# Patient Record
Sex: Female | Born: 1955 | Race: Black or African American | Hispanic: No | Marital: Married
Health system: Southern US, Community
[De-identification: ages and names within clinical notes are randomized; demographics above are authoritative.]

## PROBLEM LIST (undated history)

## (undated) SURGERY — Surgical Case
Anesthesia: *Unknown

---

## 2020-06-30 ENCOUNTER — Encounter (HOSPITAL_COMMUNITY): Payer: Self-pay

## 2020-06-30 ENCOUNTER — Encounter (HOSPITAL_COMMUNITY)
Admission: EM | Disposition: A | Discharge status: Home / Self Care | Payer: Self-pay | Source: Home / Self Care | Attending: Internal Medicine

## 2020-06-30 ENCOUNTER — Ambulatory Visit: Payer: Self-pay | Admitting: Oral Surgery

## 2020-06-30 ENCOUNTER — Inpatient Hospital Stay (HOSPITAL_COMMUNITY): Payer: BC Managed Care – PPO | Admitting: Anesthesiology

## 2020-06-30 ENCOUNTER — Emergency Department (HOSPITAL_COMMUNITY): Payer: BC Managed Care – PPO

## 2020-06-30 ENCOUNTER — Other Ambulatory Visit: Payer: Self-pay

## 2020-06-30 ENCOUNTER — Inpatient Hospital Stay (HOSPITAL_COMMUNITY)
Admission: EM | Admit: 2020-06-30 | Discharge: 2020-07-04 | DRG: 158 | Disposition: A | Discharge status: Home / Self Care | Payer: BC Managed Care – PPO | Attending: Internal Medicine | Admitting: Internal Medicine

## 2020-06-30 DIAGNOSIS — M272 Inflammatory conditions of jaws: Secondary | ICD-10-CM | POA: Diagnosis present

## 2020-06-30 DIAGNOSIS — R0600 Dyspnea, unspecified: Secondary | ICD-10-CM | POA: Diagnosis not present

## 2020-06-30 DIAGNOSIS — Z20822 Contact with and (suspected) exposure to covid-19: Secondary | ICD-10-CM | POA: Diagnosis present

## 2020-06-30 DIAGNOSIS — I10 Essential (primary) hypertension: Secondary | ICD-10-CM | POA: Diagnosis present

## 2020-06-30 DIAGNOSIS — R339 Retention of urine, unspecified: Secondary | ICD-10-CM | POA: Diagnosis not present

## 2020-06-30 DIAGNOSIS — L0201 Cutaneous abscess of face: Secondary | ICD-10-CM | POA: Diagnosis present

## 2020-06-30 DIAGNOSIS — D72829 Elevated white blood cell count, unspecified: Secondary | ICD-10-CM | POA: Diagnosis not present

## 2020-06-30 DIAGNOSIS — K122 Cellulitis and abscess of mouth: Principal | ICD-10-CM | POA: Diagnosis present

## 2020-06-30 DIAGNOSIS — E119 Type 2 diabetes mellitus without complications: Secondary | ICD-10-CM | POA: Diagnosis present

## 2020-06-30 DIAGNOSIS — Z7984 Long term (current) use of oral hypoglycemic drugs: Secondary | ICD-10-CM

## 2020-06-30 DIAGNOSIS — R221 Localized swelling, mass and lump, neck: Secondary | ICD-10-CM | POA: Diagnosis present

## 2020-06-30 DIAGNOSIS — L0211 Cutaneous abscess of neck: Secondary | ICD-10-CM | POA: Diagnosis present

## 2020-06-30 DIAGNOSIS — K047 Periapical abscess without sinus: Secondary | ICD-10-CM | POA: Diagnosis present

## 2020-06-30 DIAGNOSIS — B955 Unspecified streptococcus as the cause of diseases classified elsewhere: Secondary | ICD-10-CM | POA: Diagnosis present

## 2020-06-30 DIAGNOSIS — E872 Acidosis: Secondary | ICD-10-CM | POA: Diagnosis present

## 2020-06-30 DIAGNOSIS — Z79899 Other long term (current) drug therapy: Secondary | ICD-10-CM

## 2020-06-30 DIAGNOSIS — F17210 Nicotine dependence, cigarettes, uncomplicated: Secondary | ICD-10-CM | POA: Diagnosis present

## 2020-06-30 DIAGNOSIS — E785 Hyperlipidemia, unspecified: Secondary | ICD-10-CM | POA: Diagnosis present

## 2020-06-30 DIAGNOSIS — T380X5A Adverse effect of glucocorticoids and synthetic analogues, initial encounter: Secondary | ICD-10-CM | POA: Diagnosis not present

## 2020-06-30 DIAGNOSIS — L03211 Cellulitis of face: Secondary | ICD-10-CM | POA: Diagnosis present

## 2020-06-30 DIAGNOSIS — E11649 Type 2 diabetes mellitus with hypoglycemia without coma: Secondary | ICD-10-CM | POA: Diagnosis not present

## 2020-06-30 DIAGNOSIS — E876 Hypokalemia: Secondary | ICD-10-CM | POA: Diagnosis present

## 2020-06-30 HISTORY — PX: MASS EXCISION: SHX2000

## 2020-06-30 LAB — CBC WITH DIFFERENTIAL/PLATELET
Abs Immature Granulocytes: 0.07 10*3/uL (ref 0.00–0.07)
Basophils Absolute: 0.1 10*3/uL (ref 0.0–0.1)
Basophils Relative: 0 %
Eosinophils Absolute: 0.1 10*3/uL (ref 0.0–0.5)
Eosinophils Relative: 1 %
HCT: 40.5 % (ref 36.0–46.0)
Hemoglobin: 12.8 g/dL (ref 12.0–15.0)
Immature Granulocytes: 1 %
Lymphocytes Relative: 13 %
Lymphs Abs: 2 10*3/uL (ref 0.7–4.0)
MCH: 23.5 pg — ABNORMAL LOW (ref 26.0–34.0)
MCHC: 31.6 g/dL (ref 30.0–36.0)
MCV: 74.3 fL — ABNORMAL LOW (ref 80.0–100.0)
Monocytes Absolute: 1.8 10*3/uL — ABNORMAL HIGH (ref 0.1–1.0)
Monocytes Relative: 12 %
Neutro Abs: 11.2 10*3/uL — ABNORMAL HIGH (ref 1.7–7.7)
Neutrophils Relative %: 73 %
Platelets: 329 10*3/uL (ref 150–400)
RBC: 5.45 MIL/uL — ABNORMAL HIGH (ref 3.87–5.11)
RDW: 13.6 % (ref 11.5–15.5)
WBC: 15.1 10*3/uL — ABNORMAL HIGH (ref 4.0–10.5)
nRBC: 0 % (ref 0.0–0.2)

## 2020-06-30 LAB — CBC
HCT: 38.6 % (ref 36.0–46.0)
Hemoglobin: 12.3 g/dL (ref 12.0–15.0)
MCH: 23.6 pg — ABNORMAL LOW (ref 26.0–34.0)
MCHC: 31.9 g/dL (ref 30.0–36.0)
MCV: 73.9 fL — ABNORMAL LOW (ref 80.0–100.0)
Platelets: 270 10*3/uL (ref 150–400)
RBC: 5.22 MIL/uL — ABNORMAL HIGH (ref 3.87–5.11)
RDW: 13.4 % (ref 11.5–15.5)
WBC: 8.3 10*3/uL (ref 4.0–10.5)
nRBC: 0 % (ref 0.0–0.2)

## 2020-06-30 LAB — BASIC METABOLIC PANEL
Anion gap: 13 (ref 5–15)
BUN: 19 mg/dL (ref 8–23)
CO2: 21 mmol/L — ABNORMAL LOW (ref 22–32)
Calcium: 10.1 mg/dL (ref 8.9–10.3)
Chloride: 105 mmol/L (ref 98–111)
Creatinine, Ser: 0.84 mg/dL (ref 0.44–1.00)
GFR, Estimated: >60 mL/min (ref >60)
Glucose, Bld: 128 mg/dL — ABNORMAL HIGH (ref 70–99)
Potassium: 4.7 mmol/L (ref 3.5–5.1)
Sodium: 139 mmol/L (ref 135–145)

## 2020-06-30 LAB — CREATININE, SERUM
Creatinine, Ser: 0.9 mg/dL (ref 0.44–1.00)
GFR, Estimated: >60 mL/min (ref >60)

## 2020-06-30 LAB — HEMOGLOBIN A1C
Hgb A1c MFr Bld: 7 % — ABNORMAL HIGH (ref 4.8–5.6)
Mean Plasma Glucose: 154.2 mg/dL

## 2020-06-30 LAB — GLUCOSE, CAPILLARY
Glucose-Capillary: 143 mg/dL — ABNORMAL HIGH (ref 70–99)
Glucose-Capillary: 159 mg/dL — ABNORMAL HIGH (ref 70–99)
Glucose-Capillary: 130 mg/dL — ABNORMAL HIGH (ref 70–99)

## 2020-06-30 LAB — LACTIC ACID, PLASMA
Lactic Acid, Venous: 1.2 mmol/L (ref 0.5–1.9)
Lactic Acid, Venous: 2.1 mmol/L (ref 0.5–1.9)

## 2020-06-30 LAB — SARS CORONAVIRUS 2 BY RT PCR (HOSPITAL ORDER, PERFORMED IN ~~LOC~~ HOSPITAL LAB): SARS Coronavirus 2: NEGATIVE

## 2020-06-30 SURGERY — EXCISION MASS
Anesthesia: General

## 2020-06-30 MED ORDER — HYDROMORPHONE HCL 1 MG/ML IJ SOLN: 0.2500 mg | INTRAMUSCULAR | Status: DC | PRN

## 2020-06-30 MED ORDER — MORPHINE SULFATE (PF) 2 MG/ML IV SOLN
1.0000 mg | INTRAVENOUS | Status: DC | PRN
Start: 2020-06-30 — End: 2020-07-04
  Administered 2020-06-30 – 2020-07-01 (×2): 1 mg via INTRAVENOUS
  Filled 2020-06-30 (×2): qty 1

## 2020-06-30 MED ORDER — FENTANYL CITRATE (PF) 250 MCG/5ML IJ SOLN
INTRAMUSCULAR | Status: AC
  Filled 2020-06-30: qty 5

## 2020-06-30 MED ORDER — DEXAMETHASONE SODIUM PHOSPHATE 10 MG/ML IJ SOLN
10.0000 mg | Freq: Once | INTRAMUSCULAR | Status: AC
  Administered 2020-06-30: 10 mg via INTRAVENOUS
  Filled 2020-06-30: qty 1

## 2020-06-30 MED ORDER — ASPIRIN EC 81 MG PO TBEC
81.0000 mg | DELAYED_RELEASE_TABLET | Freq: Every day | ORAL | Status: DC
  Administered 2020-07-01 – 2020-07-04 (×4): 81 mg via ORAL
  Filled 2020-06-30 (×5): qty 1

## 2020-06-30 MED ORDER — FENTANYL CITRATE (PF) 100 MCG/2ML IJ SOLN
INTRAMUSCULAR | Status: DC | PRN
  Administered 2020-06-30: 100 ug via INTRAVENOUS

## 2020-06-30 MED ORDER — LATANOPROST 0.005 % OP SOLN
1.0000 [drp] | Freq: Every day | OPHTHALMIC | Status: DC
  Administered 2020-06-30 – 2020-07-03 (×4): 1 [drp] via OPHTHALMIC
  Filled 2020-06-30: qty 2.5

## 2020-06-30 MED ORDER — CHLORHEXIDINE GLUCONATE CLOTH 2 % EX PADS: 6.0000 | MEDICATED_PAD | Freq: Once | CUTANEOUS | Status: DC

## 2020-06-30 MED ORDER — IOHEXOL 300 MG/ML  SOLN
75.0000 mL | Freq: Once | INTRAMUSCULAR | Status: AC | PRN
  Administered 2020-06-30: 75 mL via INTRAVENOUS

## 2020-06-30 MED ORDER — SODIUM CHLORIDE 0.9 % IV BOLUS
500.0000 mL | Freq: Once | INTRAVENOUS | Status: AC
  Administered 2020-06-30: 500 mL via INTRAVENOUS

## 2020-06-30 MED ORDER — SODIUM CHLORIDE 0.9 % IV SOLN
3.0000 g | Freq: Four times a day (QID) | INTRAVENOUS | Status: DC
  Administered 2020-06-30 – 2020-07-04 (×14): 3 g via INTRAVENOUS
  Filled 2020-06-30: qty 8
  Filled 2020-06-30 (×2): qty 3
  Filled 2020-06-30: qty 8
  Filled 2020-06-30: qty 3
  Filled 2020-06-30: qty 0.15
  Filled 2020-06-30 (×2): qty 3
  Filled 2020-06-30: qty 8
  Filled 2020-06-30 (×5): qty 3
  Filled 2020-06-30: qty 0.15
  Filled 2020-06-30: qty 3
  Filled 2020-06-30 (×2): qty 8

## 2020-06-30 MED ORDER — PRAVASTATIN SODIUM 10 MG PO TABS
20.0000 mg | ORAL_TABLET | Freq: Every day | ORAL | Status: DC
  Administered 2020-07-01 – 2020-07-04 (×4): 20 mg via ORAL
  Filled 2020-06-30 (×5): qty 2

## 2020-06-30 MED ORDER — LACTATED RINGERS IV SOLN: INTRAVENOUS | Status: DC | PRN

## 2020-06-30 MED ORDER — SODIUM CHLORIDE 0.9 % IV BOLUS
1000.0000 mL | Freq: Once | INTRAVENOUS | Status: AC
  Administered 2020-06-30: 1000 mL via INTRAVENOUS

## 2020-06-30 MED ORDER — LIDOCAINE 2% (20 MG/ML) 5 ML SYRINGE
INTRAMUSCULAR | Status: DC | PRN
  Administered 2020-06-30: 3 mg via INTRAVENOUS

## 2020-06-30 MED ORDER — 0.9 % SODIUM CHLORIDE (POUR BTL) OPTIME
TOPICAL | Status: DC | PRN
  Administered 2020-06-30: 1000 mL

## 2020-06-30 MED ORDER — LACTATED RINGERS IV SOLN: INTRAVENOUS | Status: AC

## 2020-06-30 MED ORDER — ENOXAPARIN SODIUM 40 MG/0.4ML ~~LOC~~ SOLN
40.0000 mg | SUBCUTANEOUS | Status: DC
  Administered 2020-07-01 – 2020-07-04 (×4): 40 mg via SUBCUTANEOUS
  Filled 2020-06-30 (×4): qty 0.4

## 2020-06-30 MED ORDER — MEPERIDINE HCL 25 MG/ML IJ SOLN: 6.2500 mg | INTRAMUSCULAR | Status: DC | PRN

## 2020-06-30 MED ORDER — MIDAZOLAM HCL 5 MG/5ML IJ SOLN
INTRAMUSCULAR | Status: DC | PRN
  Administered 2020-06-30: 2 mg via INTRAVENOUS

## 2020-06-30 MED ORDER — LIDOCAINE-EPINEPHRINE 1 %-1:100000 IJ SOLN
INTRAMUSCULAR | Status: DC | PRN
  Administered 2020-06-30: 8 mL

## 2020-06-30 MED ORDER — GABAPENTIN 300 MG PO CAPS
300.0000 mg | ORAL_CAPSULE | Freq: Three times a day (TID) | ORAL | Status: DC
  Administered 2020-07-01 – 2020-07-04 (×10): 300 mg via ORAL
  Filled 2020-06-30 (×11): qty 1

## 2020-06-30 MED ORDER — SUCCINYLCHOLINE CHLORIDE 20 MG/ML IJ SOLN
INTRAMUSCULAR | Status: DC | PRN
  Administered 2020-06-30: 100 mg via INTRAVENOUS

## 2020-06-30 MED ORDER — VANCOMYCIN HCL IN DEXTROSE 1-5 GM/200ML-% IV SOLN
1000.0000 mg | INTRAVENOUS | Status: DC
  Filled 2020-06-30: qty 200

## 2020-06-30 MED ORDER — PROPOFOL 10 MG/ML IV BOLUS
INTRAVENOUS | Status: DC | PRN
  Administered 2020-06-30: 200 mg via INTRAVENOUS

## 2020-06-30 MED ORDER — VANCOMYCIN HCL 1250 MG/250ML IV SOLN
1250.0000 mg | Freq: Once | INTRAVENOUS | Status: AC
  Administered 2020-06-30: 1250 mg via INTRAVENOUS
  Filled 2020-06-30: qty 250

## 2020-06-30 MED ORDER — PROMETHAZINE HCL 25 MG/ML IJ SOLN
6.2500 mg | INTRAMUSCULAR | Status: DC | PRN
Start: 2020-06-30 — End: 2020-06-30

## 2020-06-30 MED ORDER — LISINOPRIL 2.5 MG PO TABS
2.5000 mg | ORAL_TABLET | Freq: Every day | ORAL | Status: DC
  Administered 2020-07-01 – 2020-07-03 (×3): 2.5 mg via ORAL
  Filled 2020-06-30 (×4): qty 1

## 2020-06-30 MED ORDER — LIDOCAINE-EPINEPHRINE 2 %-1:100000 IJ SOLN
INTRAMUSCULAR | Status: AC
  Filled 2020-06-30: qty 1

## 2020-06-30 MED ORDER — INSULIN ASPART 100 UNIT/ML ~~LOC~~ SOLN
0.0000 [IU] | SUBCUTANEOUS | Status: DC
  Administered 2020-06-30: 1 [IU] via SUBCUTANEOUS
  Administered 2020-07-01: 2 [IU] via SUBCUTANEOUS
  Administered 2020-07-01 (×2): 1 [IU] via SUBCUTANEOUS
  Administered 2020-07-01: 2 [IU] via SUBCUTANEOUS
  Administered 2020-07-02 (×4): 1 [IU] via SUBCUTANEOUS
  Administered 2020-07-02 (×2): 2 [IU] via SUBCUTANEOUS
  Administered 2020-07-02: 1 [IU] via SUBCUTANEOUS
  Administered 2020-07-03 – 2020-07-04 (×7): 2 [IU] via SUBCUTANEOUS

## 2020-06-30 MED ORDER — DEXMEDETOMIDINE HCL IN NACL 80 MCG/20ML IV SOLN
INTRAVENOUS | Status: AC
  Filled 2020-06-30: qty 20

## 2020-06-30 MED ORDER — CHLORHEXIDINE GLUCONATE 0.12 % MT SOLN
15.0000 mL | OROMUCOSAL | Status: AC
  Filled 2020-06-30: qty 15

## 2020-06-30 MED ORDER — VITAMIN B-12 1000 MCG PO TABS
1000.0000 ug | ORAL_TABLET | Freq: Every day | ORAL | Status: DC
  Administered 2020-07-01 – 2020-07-04 (×4): 1000 ug via ORAL
  Filled 2020-06-30 (×5): qty 1

## 2020-06-30 SURGICAL SUPPLY — 27 items
BLADE SURG 15 STRL LF DISP TIS (BLADE) ×1 IMPLANT
BLADE SURG 15 STRL SS (BLADE) ×1
BNDG GAUZE ELAST 4 BULKY (GAUZE/BANDAGES/DRESSINGS) ×2 IMPLANT
COVER SURGICAL LIGHT HANDLE (MISCELLANEOUS) ×2 IMPLANT
COVER WAND RF STERILE (DRAPES) ×2 IMPLANT
DECANTER SPIKE VIAL GLASS SM (MISCELLANEOUS) ×2 IMPLANT
DERMABOND ADVANCED (GAUZE/BANDAGES/DRESSINGS) ×1
DERMABOND ADVANCED .7 DNX12 (GAUZE/BANDAGES/DRESSINGS) ×1 IMPLANT
DRAPE HALF SHEET 40X57 (DRAPES) ×2 IMPLANT
ELECT COATED BLADE 2.86 ST (ELECTRODE) ×2 IMPLANT
ELECT REM PT RETURN 9FT ADLT (ELECTROSURGICAL) ×2
ELECTRODE REM PT RTRN 9FT ADLT (ELECTROSURGICAL) ×1 IMPLANT
GAUZE 4X4 16PLY RFD (DISPOSABLE) ×2 IMPLANT
GLOVE BIOGEL M 7.0 STRL (GLOVE) ×2 IMPLANT
GOWN STRL REUS W/ TWL LRG LVL3 (GOWN DISPOSABLE) ×2 IMPLANT
GOWN STRL REUS W/TWL LRG LVL3 (GOWN DISPOSABLE) ×2
KIT BASIN OR (CUSTOM PROCEDURE TRAY) ×2 IMPLANT
KIT TURNOVER KIT B (KITS) ×2 IMPLANT
NEEDLE HYPO 25GX1X1/2 BEV (NEEDLE) ×2 IMPLANT
NS IRRIG 1000ML POUR BTL (IV SOLUTION) ×2 IMPLANT
PAD ARMBOARD 7.5X6 YLW CONV (MISCELLANEOUS) ×4 IMPLANT
PENCIL SMOKE EVACUATOR (MISCELLANEOUS) ×2 IMPLANT
SUT CHROMIC 3 0 SH 27 (SUTURE) ×4 IMPLANT
SUT SILK 3 0SH CR/8 30 (SUTURE) ×2 IMPLANT
SYR CONTROL 10ML LL (SYRINGE) ×2 IMPLANT
TOWEL GREEN STERILE (TOWEL DISPOSABLE) ×2 IMPLANT
TRAY ENT MC OR (CUSTOM PROCEDURE TRAY) ×2 IMPLANT

## 2020-06-30 NOTE — Op Note (Signed)
06/30/2020  7:57 PM  PROCEDURE:  Procedure(s): I&D Mandible Abscess (N/A)  SURGEON:  Surgeon(s) and Role:    Ross Marcus, Lavell Anchors, DMD - Primary  PRE-OPERATIVE DIAGNOSIS:  Ludwig's Angina POST-OPERATIVE DIAGNOSIS:  Same   Procedure: Extra-oral I&D of bilateral sublingual abscess (41015 x 2) Extra-oral I&D of submental abscess (26948) Extra-oral I&D of bilateral submandibular abscess (41017 x 2)   Description of Operation/Procedure:   The patient was encountered in [facility] OR Room 5.  General anesthesia was induced and an oral endotracheal tube was secured in the standard fashion.  The table was moved slightly away from anesthesia and the patient was properly padded, relieving all pressure points.  A formal time-out was executed.  2% lidocaine with 1:100,000 epinephrine was infiltrated into the proposed surgical sites.  The patient was prepped and draped in the standard sterile fashion and a throat pack was placed.                   Using an 18 gauge needle, aspiration of the facial swelling was performed via an extraoral approach and was sent for aerobic and anaerobic cultures and a stat Gram stain.   Attention was then directed intraorally. A full thickness mucoperiosteal flap was elevated on the lingual aspect. A right submandibular and submental transcutaneous drainage sites were then carefully marked beneath the inferior border of the mandible to allow dependent drainage, avoid neurovascular structures, and to promote esthetics.  Incisions were made through skin and subcutaneous tissues and hemostasis was obtained.  The platysma was identified and divided.  Blunt dissection was carried to the mandible with intraoral digital guidance.  The following spaces were bluntly explored yielding purulent drainage: bilateral submandibular, sublingual, and submental.   Intraoral access was used to assure proper 1/4'" Penrose drain placement into each fascial space.  A total of 4 drains were placed and  secured with 3-0 nylon sutures in the standard fashion.  All areas were thoroughly irrigated.  The full thickness mucoperiosteal flap was reapproximated with 3-0 chromic gut suture.  A burn net dressing was fashioned to secure kerlix fluffs over the extraoral drains.The oral cavity was suctioned free of all debris and secretions.  The teeth were brushed. The throat pack was removed.  Sponge and needle counts were correct x 2.  Care of the patient was turned over to the Anesthesia team for uneventful extubation and delivery of the patient to the PACU in stable condition.  ANESTHESIA:   general EBL:  50 mL  DRAINS: Penrose drain in the neck x 4 LOCAL MEDICATIONS USED:  LIDOCAINE 2% w/ 1:100000 epi 8 cc SPECIMEN:  Aspirate - cultures for anaerobes, aerobes, and fungal PLAN OF CARE: Return to floor PATIENT DISPOSITION:  PACU - hemodynamically stable. Delay start of Pharmacological VTE agent (>24hrs) due to surgical blood loss or risk of bleeding: no  OMFS Recommendations -Liquid diet today, advance to soft mechanical diet tomorrow -Peridex (chlorohexidine) mouthrnise QID -Change Kerlix dressing as needed -Continue Unasyn 3g q6hr -Follow cultures -Daily CBC w/ diff

## 2020-06-30 NOTE — Consult Note (Addendum)
HPI: Regina Peterson is an 65 y.o. female with h/o HTN, DM2 developed facial swelling secondary to a dental abscess over a week ago. She was seen by Dr. Thea Gist in the office on 06/26/20 at which time she had her remaining mandibular teeth extracted and an incision & drainage of the submental space performed. She was discharged on Clindamycin and since the procedure she reports persistent swelling of the area and dysphagia that has not gotten significantly better or worse. She denies any dyspnea, trismus, or fever.   History reviewed. No pertinent past medical history.  History reviewed. No pertinent surgical history.  No family history on file.  Social History:  has no history on file for tobacco use, alcohol use, and drug use.  Allergies: Not on File  Medications: I have reviewed the patient's current medications.  Results for orders placed or performed during the hospital encounter of 06/30/20 (from the past 48 hour(s))  Basic metabolic panel     Status: Abnormal   Collection Time: 06/30/20 12:25 PM  Result Value Ref Range   Sodium 139 135 - 145 mmol/L   Potassium 4.7 3.5 - 5.1 mmol/L    Comment: SLIGHT HEMOLYSIS   Chloride 105 98 - 111 mmol/L   CO2 21 (L) 22 - 32 mmol/L   Glucose, Bld 128 (H) 70 - 99 mg/dL    Comment: Glucose reference range applies only to samples taken after fasting for at least 8 hours.   BUN 19 8 - 23 mg/dL   Creatinine, Ser 2.42 0.44 - 1.00 mg/dL   Calcium 68.3 8.9 - 41.9 mg/dL   GFR, Estimated >62 >22 mL/min    Comment: (NOTE) Calculated using the CKD-EPI Creatinine Equation (2021)    Anion gap 13 5 - 15    Comment: Performed at [facility], [address] [place], [place] [address]  CBC with Differential     Status: Abnormal   Collection Time: 06/30/20 12:25 PM  Result Value Ref Range   WBC 15.1 (H) 4.0 - 10.5 K/uL   RBC 5.45 (H) 3.87 - 5.11 MIL/uL   Hemoglobin 12.8 12.0 - 15.0 g/dL   HCT 21.1 94.1 - 74.0 %   MCV 74.3 (L) 80.0 - 100.0  fL   MCH 23.5 (L) 26.0 - 34.0 pg   MCHC 31.6 30.0 - 36.0 g/dL   RDW 81.4 48.1 - 85.6 %   Platelets 329 150 - 400 K/uL   nRBC 0.0 0.0 - 0.2 %   Neutrophils Relative % 73 %   Neutro Abs 11.2 (H) 1.7 - 7.7 K/uL   Lymphocytes Relative 13 %   Lymphs Abs 2.0 0.7 - 4.0 K/uL   Monocytes Relative 12 %   Monocytes Absolute 1.8 (H) 0.1 - 1.0 K/uL   Eosinophils Relative 1 %   Eosinophils Absolute 0.1 0.0 - 0.5 K/uL   Basophils Relative 0 %   Basophils Absolute 0.1 0.0 - 0.1 K/uL   Immature Granulocytes 1 %   Abs Immature Granulocytes 0.07 0.00 - 0.07 K/uL    Comment: Performed at [facility], [address], [place], [place] [address]    CT Soft Tissue Neck W Contrast  Result Date: 06/30/2020 CLINICAL DATA:  Neck abscess, deep soft tissue swelling. Post dental. EXAM: CT NECK WITH CONTRAST TECHNIQUE: Multidetector CT imaging of the neck was performed using the standard protocol following the bolus administration of intravenous contrast. CONTRAST:  62mL OMNIPAQUE IOHEXOL 300 MG/ML  SOLN COMPARISON:  None. FINDINGS:  There is a large multiloculated fluid collection involving the right eccentric floor of mouth with mild peripheral enhancement, which may extend from the root of the recently extracted mandibular teeth anteriorly on the right. The abscess is complex, measuring up to 3.5 by 3.8 x 1.6 (AP by transverse by craniocaudal). Abscess extends inferiorly to abut the hyoid without bony destruction. Inflammatory changes track posteriorly to involve the base of tongue region and submandibular spaces with mild mass effect on the airway, which remains patent. Inflammatory changes also extend inferiorly into the subcutaneous fat, compatible cellulitis. There is also myositis of the mylohyoid. There is reactive lymphadenopathy. An airfilled tube in the subcutaneous tissues of the chin which extends outside the skin, presumably surgical. Multiple extracted teeth. Unremarkable parotid glands. Normal thyroid.  Visualized upper chest is negative. Visualized intracranial brain is grossly unremarkable. Vasculature is grossly patent. Unremarkable visualized orbits. Mild paranasal sinus mucosal thickening without air-fluid levels. Extensive multilevel degenerative change with prior ACDF. IMPRESSION: 1. Multilocular complex floor mouth abscess, likely odontogenic in origin in this patient status post recent dental extractions. Extensive associated edema extends posteriorly with mild mass effect on the oropharynx. Associated myositis of the mylohyoid and surrounding cellulitis. 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 2. An airfilled tube in the subcutaneous tissues of the chin which extends outside the skin, presumably surgical. Recommend correlation with direct inspection. 3. Reactive lymphadenopathy. Findings discussed with Dr. Jodi Mourning at 2:44 p.m. via telephone. Electronically Signed   By: Feliberto Harts MD   On: 06/30/2020 14:45    Review of Systems  HENT: Positive for dental problem, facial swelling, sore throat and trouble swallowing.   Respiratory: Negative.   Psychiatric/Behavioral: Negative.    Blood pressure (!) 178/89, pulse (!) 111, temperature 98.7 F (37.1 C), temperature source Oral, resp. rate 16, SpO2 99 %. Physical Exam HENT:     Head:     Jaw: Tenderness and swelling present.     Mouth/Throat:     Dentition: Dental abscesses present.     Palate: No mass.     Pharynx: Oropharynx is clear. Uvula midline.    Extraoral - appreciable facial swelling in the submental and bilateral submandibular regions, inferior border of the mandible not palpable. Skin intact with no breakdown. No trismus with MIO 6mm. Submental incision and drain in place with slight serosanguinous drainage.  Intraoral - recent extraction sockets and incisions approximated/closing with drain visible intraorally, no vestibular swelling however FOM is erythematous/edematous  bilaterally and tongue is elevated; oropharynx is clear w/o palatal draping.   Assessment/Plan:  54 yoF with odontogenic infection consistent with Ludwig's angina involving the bilateral submandibular, sublingual, and submental spaces s/p attempted I&D in clinical setting with persistent fluid collection. Patient is NPO, adding patient on to OR for I&D tonight. Patient will be admitted post-operatively, agree with Unasyn 3g q6hr.    Elzia Hott J Argusta Mcgann 06/30/2020, 3:35 PM

## 2020-06-30 NOTE — Anesthesia Preprocedure Evaluation (Addendum)
Anesthesia Evaluation  Patient identified by MRN, date of birth, ID band Patient awake    Reviewed: Allergy & Precautions, NPO status , Patient's Chart, lab work & pertinent test results, Unable to perform ROS - Chart review only  Airway Mallampati: III   Neck ROM: Limited   Comment: Patient edentulous, opens mouth adequately. Brawny, indurated submandibular edema. No stridor, mild hoarseness. No respiratory distress. Dental  (+) Edentulous Upper, Edentulous Lower   Pulmonary neg pulmonary ROS,    breath sounds clear to auscultation       Cardiovascular negative cardio ROS   Rhythm:Regular Rate:Normal     Neuro/Psych negative neurological ROS     GI/Hepatic negative GI ROS, Neg liver ROS,   Endo/Other  negative endocrine ROS  Renal/GU negative Renal ROS     Musculoskeletal negative musculoskeletal ROS (+)   Abdominal   Peds  Hematology negative hematology ROS (+)   Anesthesia Other Findings   Reproductive/Obstetrics                            Anesthesia Physical Anesthesia Plan  ASA: IV and emergent  Anesthesia Plan: General   Post-op Pain Management:    Induction: Intravenous  PONV Risk Score and Plan: 4 or greater and Ondansetron, Dexamethasone, Midazolam, Scopolamine patch - Pre-op and Treatment may vary due to age or medical condition  Airway Management Planned: Oral ETT and Video Laryngoscope Planned  Additional Equipment:   Intra-op Plan:   Post-operative Plan: Extubation in OR  Informed Consent: I have reviewed the patients History and Physical, chart, labs and discussed the procedure including the risks, benefits and alternatives for the proposed anesthesia with the patient or authorized representative who has indicated his/her understanding and acceptance.       Plan Discussed with: CRNA and Anesthesiologist  Anesthesia Plan Comments: (CT scan reviewed, no airway  impingement or edema seen. Plan GA with glide sope  Kipp Brood)       Anesthesia Quick Evaluation

## 2020-06-30 NOTE — Anesthesia Procedure Notes (Signed)
Procedure Name: Intubation Date/Time: 06/30/2020 7:02 PM Performed by: Gwenyth Allegra, CRNA Pre-anesthesia Checklist: Patient identified, Emergency Drugs available, Suction available, Patient being monitored and Timeout performed Patient Re-evaluated:Patient Re-evaluated prior to induction Preoxygenation: Pre-oxygenation with 100% oxygen Induction Type: IV induction and Rapid sequence Tube size: 7.0 mm Placement Confirmation: ETT inserted through vocal cords under direct vision,  positive ETCO2 and breath sounds checked- equal and bilateral Secured at: 21 cm Tube secured with: Tape Dental Injury: Teeth and Oropharynx as per pre-operative assessment

## 2020-06-30 NOTE — Anesthesia Postprocedure Evaluation (Signed)
Anesthesia Post Note  Patient: Regina Peterson  Procedure(s) Performed: I&D Mandible Abscess (N/A )     Patient location during evaluation: PACU Anesthesia Type: General Level of consciousness: awake and alert Pain management: pain level controlled Vital Signs Assessment: post-procedure vital signs reviewed and stable Respiratory status: spontaneous breathing, nonlabored ventilation, respiratory function stable and patient connected to nasal cannula oxygen Cardiovascular status: blood pressure returned to baseline and stable Postop Assessment: no apparent nausea or vomiting Anesthetic complications: no Comments: Patient mildly anxious, no stridor, no evidence of airway obstruction. OK to go to 4E.  Kipp Brood   No complications documented.  Last Vitals:  Vitals:   06/30/20 2030 06/30/20 2045  BP: (!) 188/98 (!) 164/85  Pulse: (!) 106 95  Resp: 17 20  Temp:    SpO2: 91% 98%    Last Pain:  Vitals:   06/30/20 2045  TempSrc:   PainSc: 3                  Hassen Bruun COKER

## 2020-06-30 NOTE — H&P (Signed)
History and Physical    Regina Peterson XHB:[id number] DOB: Jan 02, 1956 DOA: 06/30/2020  PCP: Patient, No Pcp Per Patient coming from:   I have personally briefly reviewed patient's old medical records in [facility]  Chief Complaint: Lower jaw and neck swelling  HPI: Regina Peterson is a 65 y.o. female with medical history significant of non-insulin-dependent type 2 diabetes, hypertension, hyperlipidemia, sent by oral surgeon for further evaluation of lower drawl and neck swelling.  Patient had bottom teeth extracted on January 24, started noticed swelling about a week ago, smokes cigarettes more than 10 a day difficulty swallowing but no short of breath, no fever.  ED Course: Blood pressure 139/70, pulse 97, temperature 98.7 F (37.1 C), temperature source Oral, resp. rate 16, SpO2 99 %.   CT soft tissue neck showed "Multilocular complex floor mouth abscess" Basic labs obtained in the ED: WBC 15.1, glucose 128, lactic acid 1.2, creatinine 0.84, blood culture collected in the ED, IV Vanc and Unasyn ordered by EDP, oral surgeon plan to bring  patient to the OR tonight, due to history of diabetes or surgeon request hospitalist to admit the patient  Review of Systems: As per HPI otherwise all other systems reviewed and are negative. medical history significant of non-insulin-dependent type 2 diabetes, hypertension, hyperlipidemia,    Social History Cigarette smoking more than 10 a day Denies alcohol or drug use     Physical Exam: Vitals:   06/30/20 1219  BP: (!) 178/89  Pulse: (!) 111  Resp: 16  Temp: 98.7 F (37.1 C)  TempSrc: Oral  SpO2: 99%    Constitutional: NAD, calm, comfortable Eyes: PERRL, lids and conjunctivae normal ENMT: Submandibular swelling, difficult to open mouth Respiratory: clear to auscultation bilaterally, no wheezing, no crackles. Normal respiratory effort. No accessory muscle use.  Cardiovascular: Regular rate and rhythm,  No extremity edema. 2+ pedal  pulses. No carotid bruits.  Abdomen: no tenderness, not distended, Bowel sounds positive.  Musculoskeletal: no clubbing / cyanosis. No joint deformity upper and lower extremities. Good ROM, no contractures. Normal muscle tone.  Skin: no rashes, lesions, ulcers. No induration Neurologic: CN 2-12 grossly intact. Sensation intact, Strength 5/5 in all 4.  Psychiatric: Normal judgment and insight. Alert and oriented x 3. Normal mood.    Labs on Admission: I have personally reviewed following labs and imaging studies  CBC: Recent Labs  Lab 06/30/20 1225  WBC 15.1*  NEUTROABS 11.2*  HGB 12.8  HCT 40.5  MCV 74.3*  PLT 329    Basic Metabolic Panel: Recent Labs  Lab 06/30/20 1225  NA 139  K 4.7  CL 105  CO2 21*  GLUCOSE 128*  BUN 19  CREATININE 0.84  CALCIUM 10.1    GFR: CrCl cannot be calculated (Unknown ideal weight.).  Liver Function Tests: No results for input(s): AST, ALT, ALKPHOS, BILITOT, PROT, ALBUMIN in the last 168 hours.  Urine analysis: No results found for: COLORURINE, APPEARANCEUR, LABSPEC, PHURINE, GLUCOSEU, HGBUR, BILIRUBINUR, KETONESUR, PROTEINUR, UROBILINOGEN, NITRITE, LEUKOCYTESUR  Radiological Exams on Admission: CT Soft Tissue Neck W Contrast  Result Date: 06/30/2020 CLINICAL DATA:  Neck abscess, deep soft tissue swelling. Post dental. EXAM: CT NECK WITH CONTRAST TECHNIQUE: Multidetector CT imaging of the neck was performed using the standard protocol following the bolus administration of intravenous contrast. CONTRAST:  37mL OMNIPAQUE IOHEXOL 300 MG/ML  SOLN COMPARISON:  None. FINDINGS: There is a large multiloculated fluid collection involving the right eccentric floor of mouth with mild peripheral enhancement, which may extend from the  root of the recently extracted mandibular teeth anteriorly on the right. The abscess is complex, measuring up to 3.5 by 3.8 x 1.6 (AP by transverse by craniocaudal). Abscess extends inferiorly to abut the hyoid without bony  destruction. Inflammatory changes track posteriorly to involve the base of tongue region and submandibular spaces with mild mass effect on the airway, which remains patent. Inflammatory changes also extend inferiorly into the subcutaneous fat, compatible cellulitis. There is also myositis of the mylohyoid. There is reactive lymphadenopathy. An airfilled tube in the subcutaneous tissues of the chin which extends outside the skin, presumably surgical. Multiple extracted teeth. Unremarkable parotid glands. Normal thyroid. Visualized upper chest is negative. Visualized intracranial brain is grossly unremarkable. Vasculature is grossly patent. Unremarkable visualized orbits. Mild paranasal sinus mucosal thickening without air-fluid levels. Extensive multilevel degenerative change with prior ACDF. IMPRESSION: 1. Multilocular complex floor mouth abscess, likely odontogenic in origin in this patient status post recent dental extractions. Extensive associated edema extends posteriorly with mild mass effect on the oropharynx. Associated myositis of the mylohyoid and surrounding cellulitis. 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 2. An airfilled tube in the subcutaneous tissues of the chin which extends outside the skin, presumably surgical. Recommend correlation with direct inspection. 3. Reactive lymphadenopathy. Findings discussed with Dr. Jodi Mourning at 2:44 p.m. via telephone. Electronically Signed   By: Feliberto Harts MD   On: 06/30/2020 14:45    Assessment/Plan Active Problems:   * No active hospital problems. *   Ludwig angina -Continue antibiotics - OR tonight -Management per oral surgery  Noninsulin-dependent type 2 diabetes We will check A1c, Hold Metformin Start SSI  Hypertension Blood pressure stable, renal function stable Plan to resume lisinopril tomorrow  Hyperlipidemia continue Pravachol   DVT prophylaxis: SCD's Start: 06/30/20 1540   Code  Status:    Family Communication:  Husband at bedside  Patient is from: Home    Anticipated DC to: Home   Anticipated DC date: To be determined, need oral surgeon clearance    Consults called:  Oral surgery Admission status:  Inpatient  Severity of Illness:   The appropriate patient status for this patient is INPATIENT due to history and comorbidities, severity of illness, required intensity of service to ensure the patient's safety and to avoid risk of adverse events/further clinical deterioration.  Severity of illness/comorbidities: Ludewig angina, diabetes Intensity of service: tests, high frequency of surveillance, interventions It is not anticipated that the patient will be medically stable for discharge from the hospital within 2 midnights of admission.    Voice Recognition Reubin Milan dictation system was used to create this note, attempts have been made to correct errors. Please contact the author with questions and/or clarifications.  Albertine Grates MD PhD FACP [facility]   06/30/2020, 4:18 PM

## 2020-06-30 NOTE — Transfer of Care (Signed)
Immediate Anesthesia Transfer of Care Note  Patient: Regina Peterson  Procedure(s) Performed: I&D Mandible Abscess (N/A )  Patient Location: PACU  Anesthesia Type:General  Level of Consciousness: awake, alert  and oriented  Airway & Oxygen Therapy: Patient Spontanous Breathing  Post-op Assessment: Report given to RN and Post -op Vital signs reviewed and stable  Post vital signs: Reviewed and stable  Last Vitals:  Vitals Value Taken Time  BP 184/90 06/30/20 2011  Temp    Pulse 99 06/30/20 2012  Resp 23 06/30/20 2012  SpO2 94 % 06/30/20 2012  Vitals shown include unvalidated device data.  Last Pain:  Vitals:   06/30/20 1219  TempSrc: Oral         Complications: No complications documented.

## 2020-06-30 NOTE — ED Triage Notes (Signed)
Pt sent by oral surgeon for further evaluation of lower jaw and neck swelling. Pt had all her bottom teeth extracted on 1/24, started to noticed swelling about a week ago on Friday but they couldn't see her until today. Pt reports difficulty swallowing but airway intact.

## 2020-06-30 NOTE — ED Provider Notes (Signed)
[facility] EMERGENCY DEPARTMENT Provider Note   CSN: [id number] Arrival date & time: 06/30/20  1134     History Chief Complaint  Patient presents with  . Facial Swelling    Regina Peterson is a 65 y.o. female.  Patient presents with worsening facial swelling and neck pain for the past week.  Patient had majority of her bottom teeth extracted on June 24 by oral surgery.  Patient's had gradually worsening swelling and difficulty swallowing since then.  Patient denies fevers chills or shortness of breath.        History reviewed. No pertinent past medical history.  There are no problems to display for this patient.   History reviewed. No pertinent surgical history.   OB History   No obstetric history on file.     No family history on file.     Home Medications Prior to Admission medications   Not on File    Allergies    Patient has no allergy information on record.  Review of Systems   Review of Systems  Constitutional: Negative for chills and fever.  HENT: Positive for congestion and sore throat.   Eyes: Negative for visual disturbance.  Respiratory: Negative for shortness of breath.   Cardiovascular: Negative for chest pain.  Gastrointestinal: Negative for abdominal pain and vomiting.  Genitourinary: Negative for dysuria and flank pain.  Musculoskeletal: Negative for back pain, neck pain and neck stiffness.  Skin: Negative for rash.  Neurological: Negative for light-headedness and headaches.    Physical Exam Updated Vital Signs BP (!) 178/89   Pulse (!) 111   Temp 98.7 F (37.1 C) (Oral)   Resp 16   SpO2 99%   Physical Exam Vitals and nursing note reviewed.  Constitutional:      Appearance: She is well-developed and well-nourished. She is ill-appearing.  HENT:     Head: Normocephalic.     Comments: Patient has significant edema tenderness and induration sublingual and submental extending anterior cervical.  No stridor.  Voice  change appreciated.  No significant trismus.  Patient has drain submental region nothing draining at this time in addition to drain sublingual anterior region. Eyes:     General:        Right eye: No discharge.        Left eye: No discharge.     Conjunctiva/sclera: Conjunctivae normal.  Neck:     Trachea: No tracheal deviation.  Cardiovascular:     Rate and Rhythm: Normal rate and regular rhythm.  Pulmonary:     Effort: Pulmonary effort is normal.     Breath sounds: Normal breath sounds.  Abdominal:     General: There is no distension.     Palpations: Abdomen is soft.     Tenderness: There is no abdominal tenderness. There is no guarding.  Musculoskeletal:        General: No edema.     Cervical back: Normal range of motion and neck supple.  Skin:    General: Skin is warm.     Capillary Refill: Capillary refill takes less than 2 seconds.     Findings: No rash.  Neurological:     Mental Status: She is alert and oriented to person, place, and time.  Psychiatric:        Mood and Affect: Mood and affect and mood normal.     ED Results / Procedures / Treatments   Labs (all labs ordered are listed, but only abnormal results are displayed) Labs Reviewed  BASIC METABOLIC PANEL - Abnormal; Notable for the following components:      Result Value   CO2 21 (*)    Glucose, Bld 128 (*)    All other components within normal limits  CBC WITH DIFFERENTIAL/PLATELET - Abnormal; Notable for the following components:   WBC 15.1 (*)    RBC 5.45 (*)    MCV 74.3 (*)    MCH 23.5 (*)    Neutro Abs 11.2 (*)    Monocytes Absolute 1.8 (*)    All other components within normal limits  CULTURE, BLOOD (ROUTINE X 2)  CULTURE, BLOOD (ROUTINE X 2)  SARS CORONAVIRUS 2 BY RT PCR (HOSPITAL ORDER, PERFORMED IN [facility])  LACTIC ACID, PLASMA  LACTIC ACID, PLASMA    EKG None  Radiology CT Soft Tissue Neck W Contrast  Result Date: 06/30/2020 CLINICAL DATA:  Neck abscess, deep soft  tissue swelling. Post dental. EXAM: CT NECK WITH CONTRAST TECHNIQUE: Multidetector CT imaging of the neck was performed using the standard protocol following the bolus administration of intravenous contrast. CONTRAST:  97mL OMNIPAQUE IOHEXOL 300 MG/ML  SOLN COMPARISON:  None. FINDINGS: There is a large multiloculated fluid collection involving the right eccentric floor of mouth with mild peripheral enhancement, which may extend from the root of the recently extracted mandibular teeth anteriorly on the right. The abscess is complex, measuring up to 3.5 by 3.8 x 1.6 (AP by transverse by craniocaudal). Abscess extends inferiorly to abut the hyoid without bony destruction. Inflammatory changes track posteriorly to involve the base of tongue region and submandibular spaces with mild mass effect on the airway, which remains patent. Inflammatory changes also extend inferiorly into the subcutaneous fat, compatible cellulitis. There is also myositis of the mylohyoid. There is reactive lymphadenopathy. An airfilled tube in the subcutaneous tissues of the chin which extends outside the skin, presumably surgical. Multiple extracted teeth. Unremarkable parotid glands. Normal thyroid. Visualized upper chest is negative. Visualized intracranial brain is grossly unremarkable. Vasculature is grossly patent. Unremarkable visualized orbits. Mild paranasal sinus mucosal thickening without air-fluid levels. Extensive multilevel degenerative change with prior ACDF. IMPRESSION: 1. Multilocular complex floor mouth abscess, likely odontogenic in origin in this patient status post recent dental extractions. Extensive associated edema extends posteriorly with mild mass effect on the oropharynx. Associated myositis of the mylohyoid and surrounding cellulitis. 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 2. An airfilled tube in the subcutaneous tissues of the chin which extends outside the skin,  presumably surgical. Recommend correlation with direct inspection. 3. Reactive lymphadenopathy. Findings discussed with Dr. Jodi Mourning at 2:44 p.m. via telephone. Electronically Signed   By: Feliberto Harts MD   On: 06/30/2020 14:45    Procedures .Critical Care Performed by: Blane Ohara, MD Authorized by: Blane Ohara, MD   Critical care provider statement:    Critical care time (minutes):  32   Critical care start time:  06/30/2020 3:00 PM   Critical care end time:  06/30/2020 3:32 PM   Critical care time was exclusive of:  Separately billable procedures and treating other patients and teaching time   Critical care was time spent personally by me on the following activities:  Discussions with consultants, evaluation of patient's response to treatment, examination of patient, ordering and performing treatments and interventions, ordering and review of laboratory studies, ordering and review of radiographic studies, pulse oximetry, re-evaluation of patient's condition, obtaining history from patient or surrogate and review of old charts   Care discussed  with: admitting provider   Comments:     ludwigs angina     Medications Ordered in ED Medications  vancomycin (VANCOREADY) IVPB 1250 mg/250 mL (has no administration in time range)  Ampicillin-Sulbactam (UNASYN) 3 g in sodium chloride 0.9 % 100 mL IVPB (has no administration in time range)  vancomycin (VANCOCIN) IVPB 1000 mg/200 mL premix (has no administration in time range)  iohexol (OMNIPAQUE) 300 MG/ML solution 75 mL (75 mLs Intravenous Contrast Given 06/30/20 1412)  sodium chloride 0.9 % bolus 500 mL (500 mLs Intravenous New Bag/Given 06/30/20 1522)  dexamethasone (DECADRON) injection 10 mg (10 mg Intravenous Given 06/30/20 1521)    ED Course  I have reviewed the triage vital signs and the nursing notes.  Pertinent labs & imaging results that were available during my care of the patient were reviewed by me and considered in my medical  decision making (see chart for details).    MDM Rules/Calculators/A&P                          Patient presents with clinical concern for dental abscess, specifically Ludwick's angina with majority of induration and swelling submental region.  Currently oxygen normal, no stridor.  CT scan ordered while waiting for a bed available in the ER in the waiting room, discussed with radiology concerning results with significant complex abscess extension of swelling into the oropharynx.  Reviewed CT scan results appreciating significant edema.  Discussed critical nature of this presentation with patient, discussed with ENT on-call Dr. Jodean Lima who is available if assistance needed for airway/trach.  Discussed with Dr. Ross Marcus who came the emergency room and assessed the patient and plan to take to the operating room with anesthesia intubating.  Patient does not require pain meds at this time.  Discussed n.p.o.  IV fluids ordered.  IV antibiotics discussed with pharmacy.  Covid test pending.    Final Clinical Impression(s) / ED Diagnoses Final diagnoses:  Ludwig's angina  Dental abscess    Rx / DC Orders ED Discharge Orders    None       Blane Ohara, MD 06/30/20 [phone]

## 2020-06-30 NOTE — Progress Notes (Signed)
Pharmacy Antibiotic Note  Regina Peterson is a 64 y.o. female admitted on 06/30/2020 with lower jaw/neck swelling s/p extraction of teeth, concern for Ludwigs.  Pharmacy has been consulted for Unasyn and vancomycin dosing.  Plan: Unasyn 3g IV every 6 hours Vancomycin 1250 mg IV x 1, then 1000 mg IV every 24 hours (Est AUC 505, Goal AUC 400-550, SCr 0.84) Monitor renal function, clinical progression and LOT Vancomycin levels as needed     Temp (24hrs), Avg:98.7 F (37.1 C), Min:98.7 F (37.1 C), Max:98.7 F (37.1 C)  Recent Labs  Lab 06/30/20 1225  WBC 15.1*  CREATININE 0.84    CrCl cannot be calculated (Unknown ideal weight.).    Not on File  Daylene Posey, PharmD Clinical Pharmacist ED Pharmacist Phone # [phone] 06/30/2020 3:07 PM

## 2020-07-01 ENCOUNTER — Encounter (HOSPITAL_COMMUNITY): Payer: Self-pay | Admitting: Oral Surgery

## 2020-07-01 DIAGNOSIS — E872 Acidosis: Secondary | ICD-10-CM

## 2020-07-01 DIAGNOSIS — D72829 Elevated white blood cell count, unspecified: Secondary | ICD-10-CM | POA: Diagnosis not present

## 2020-07-01 DIAGNOSIS — K122 Cellulitis and abscess of mouth: Secondary | ICD-10-CM | POA: Diagnosis not present

## 2020-07-01 DIAGNOSIS — E119 Type 2 diabetes mellitus without complications: Secondary | ICD-10-CM | POA: Diagnosis not present

## 2020-07-01 LAB — MRSA PCR SCREENING: MRSA by PCR: NEGATIVE

## 2020-07-01 LAB — GLUCOSE, CAPILLARY
Glucose-Capillary: 164 mg/dL — ABNORMAL HIGH (ref 70–99)
Glucose-Capillary: 149 mg/dL — ABNORMAL HIGH (ref 70–99)
Glucose-Capillary: 178 mg/dL — ABNORMAL HIGH (ref 70–99)
Glucose-Capillary: 106 mg/dL — ABNORMAL HIGH (ref 70–99)
Glucose-Capillary: 126 mg/dL — ABNORMAL HIGH (ref 70–99)
Glucose-Capillary: 140 mg/dL — ABNORMAL HIGH (ref 70–99)

## 2020-07-01 LAB — COMPREHENSIVE METABOLIC PANEL
ALT: 15 U/L (ref 0–44)
AST: 24 U/L (ref 15–41)
Albumin: 2.4 g/dL — ABNORMAL LOW (ref 3.5–5.0)
Alkaline Phosphatase: 94 U/L (ref 38–126)
Anion gap: 12 (ref 5–15)
BUN: 16 mg/dL (ref 8–23)
CO2: 19 mmol/L — ABNORMAL LOW (ref 22–32)
Calcium: 8.7 mg/dL — ABNORMAL LOW (ref 8.9–10.3)
Chloride: 109 mmol/L (ref 98–111)
Creatinine, Ser: 0.84 mg/dL (ref 0.44–1.00)
GFR, Estimated: >60 mL/min (ref >60)
Glucose, Bld: 128 mg/dL — ABNORMAL HIGH (ref 70–99)
Potassium: 3.3 mmol/L — ABNORMAL LOW (ref 3.5–5.1)
Sodium: 140 mmol/L (ref 135–145)
Total Bilirubin: 0.8 mg/dL (ref 0.3–1.2)
Total Protein: 6.2 g/dL — ABNORMAL LOW (ref 6.5–8.1)

## 2020-07-01 LAB — CBC
HCT: 31.8 % — ABNORMAL LOW (ref 36.0–46.0)
Hemoglobin: 10.8 g/dL — ABNORMAL LOW (ref 12.0–15.0)
MCH: 24.6 pg — ABNORMAL LOW (ref 26.0–34.0)
MCHC: 34 g/dL (ref 30.0–36.0)
MCV: 72.4 fL — ABNORMAL LOW (ref 80.0–100.0)
Platelets: 260 10*3/uL (ref 150–400)
RBC: 4.39 MIL/uL (ref 3.87–5.11)
RDW: 13.5 % (ref 11.5–15.5)
WBC: 20.7 10*3/uL — ABNORMAL HIGH (ref 4.0–10.5)
nRBC: 0 % (ref 0.0–0.2)

## 2020-07-01 LAB — LACTIC ACID, PLASMA: Lactic Acid, Venous: 0.9 mmol/L (ref 0.5–1.9)

## 2020-07-01 LAB — HIV ANTIBODY (ROUTINE TESTING W REFLEX): HIV Screen 4th Generation wRfx: NONREACTIVE

## 2020-07-01 MED ORDER — SENNOSIDES-DOCUSATE SODIUM 8.6-50 MG PO TABS
1.0000 | ORAL_TABLET | Freq: Two times a day (BID) | ORAL | Status: DC
  Administered 2020-07-01 – 2020-07-03 (×4): 1 via ORAL
  Filled 2020-07-01 (×6): qty 1

## 2020-07-01 MED ORDER — RESOURCE THICKENUP CLEAR PO POWD
ORAL | Status: DC | PRN
  Filled 2020-07-01: qty 125

## 2020-07-01 MED ORDER — LACTATED RINGERS IV BOLUS
1000.0000 mL | Freq: Once | INTRAVENOUS | Status: AC
  Administered 2020-07-01: 1000 mL via INTRAVENOUS

## 2020-07-01 MED ORDER — WHITE PETROLATUM EX OINT
TOPICAL_OINTMENT | CUTANEOUS | Status: DC | PRN
  Filled 2020-07-01: qty 28.35

## 2020-07-01 MED ORDER — POTASSIUM CHLORIDE CRYS ER 20 MEQ PO TBCR
40.0000 meq | EXTENDED_RELEASE_TABLET | Freq: Once | ORAL | Status: AC
  Administered 2020-07-01: 40 meq via ORAL
  Filled 2020-07-01: qty 2

## 2020-07-01 NOTE — Progress Notes (Signed)
PROGRESS NOTE    Regina Peterson  BTD:[id number] DOB: 1955-06-25 DOA: 06/30/2020 PCP: Patient, No Pcp Per    Chief Complaint  Patient presents with  . Facial Swelling    Brief Narrative:  Regina Peterson is a 65 y.o. female with medical history significant of non-insulin-dependent type 2 diabetes, hypertension, hyperlipidemia, sent by oral surgeon for further evaluation of lower drawl and neck swelling  CT soft tissue neck showed "Multilocular complex floor mouth abscess  Subjective:  Reports pain when she swallows, no sob, no fever Daughter at bedside   Assessment & Plan:   Active Problems:   Angina, Suella Broad angina -she received iv van in the ED -Continue Zosyn for now - Status post I&D of bilateral sublingual abscess, submental abscess, bilateral submandibular abscess on 2/4 pm  -Management per oral surgery  Leukocytosis -WBC significant jumped from 8 to 20.7 -Continue antibiotics, increase hydration,  -Monitor blood culture and wound culture -Repeat CBC  Lactic acidosis Lactic acid peaked at 2.1 Resolved after hydration  Noninsulin-dependent type 2 diabetes, fairly controlled check A1c 7% Hold Metformin Start SSI  Hypertension  renal function stable  resume lisinopril  Hyperlipidemia continue Pravachol   Postop urinary retention -700 cc removed after urine and out cath -Increase activity, monitor bladder scan   Hypokalemia Replace K, check mag  DVT prophylaxis: enoxaparin (LOVENOX) injection 40 mg Start: 07/01/20 1000 SCDs Start: 06/30/20 2129 SCD's Start: 06/30/20 1540   Code Status:full Family Communication: Daughter at bedside Disposition:   Status is: Inpatient   Dispo: The patient is from: Home              Anticipated d/c is to: Home              Anticipated d/c date is: When cleared by oral surgery                Consultants:   Oral surgery  Procedures:   I&D of mouth floor abscess  Antimicrobials:   Vanc on 2/4   zosyn from 2/4     Objective: Vitals:   06/30/20 2331 07/01/20 0000 07/01/20 0433 07/01/20 0812  BP: 133/68 (!) 141/71 128/66 (!) 152/76  Pulse: (!) 112 (!) 106 (!) 103 99  Resp: (!) 22 20 20 20   Temp: 99.7 F (37.6 C) 99.7 F (37.6 C) 98.7 F (37.1 C) 99.3 F (37.4 C)  TempSrc: Oral Oral Oral Oral  SpO2: 96% 95% 97% 95%  Weight:      Height:        Intake/Output Summary (Last 24 hours) at 07/01/2020 0948 Last data filed at 07/01/2020 0537 Gross per 24 hour  Intake 2900.79 ml  Output 700 ml  Net 2200.79 ml   Filed Weights   06/30/20 2126  Weight: 63.1 kg    Examination:  General exam: calm, NAD, submandible dressing  Respiratory system: Clear to auscultation. Respiratory effort normal. Cardiovascular system: S1 & S2 heard, RRR. No JVD, no murmur, No pedal edema. Gastrointestinal system: Abdomen is nondistended, soft and nontender. Normal bowel sounds heard. Central nervous system: Alert and oriented. No focal neurological deficits. Extremities: Symmetric 5 x 5 power. Skin: No rashes, lesions or ulcers Psychiatry: Judgement and insight appear normal. Mood & affect appropriate.     Data Reviewed: I have personally reviewed following labs and imaging studies  CBC: Recent Labs  Lab 06/30/20 1225 06/30/20 2141 07/01/20 0043  WBC 15.1* 8.3 20.7*  NEUTROABS 11.2*  --   --   HGB 12.8  12.3 10.8*  HCT 40.5 38.6 31.8*  MCV 74.3* 73.9* 72.4*  PLT 329 270 260    Basic Metabolic Panel: Recent Labs  Lab 06/30/20 1225 06/30/20 2141 07/01/20 0043  NA 139  --  140  K 4.7  --  3.3*  CL 105  --  109  CO2 21*  --  19*  GLUCOSE 128*  --  128*  BUN 19  --  16  CREATININE 0.84 0.90 0.84  CALCIUM 10.1  --  8.7*    GFR: Estimated Creatinine Clearance: 57.6 mL/min (by C-G formula based on SCr of 0.84 mg/dL).  Liver Function Tests: Recent Labs  Lab 07/01/20 0043  AST 24  ALT 15  ALKPHOS 94  BILITOT 0.8  PROT 6.2*  ALBUMIN 2.4*    CBG: Recent Labs  Lab  06/30/20 1821 06/30/20 2014 06/30/20 2328 07/01/20 0357 07/01/20 0806  GLUCAP 143* 159* 130* 164* 149*     Recent Results (from the past 240 hour(s))  SARS Coronavirus 2 by RT PCR (hospital order, performed in [facility] hospital lab) Nasopharyngeal Nasopharyngeal Swab     Status: None   Collection Time: 06/30/20  2:49 PM   Specimen: Nasopharyngeal Swab  Result Value Ref Range Status   SARS Coronavirus 2 NEGATIVE NEGATIVE Final    Comment: (NOTE) SARS-CoV-2 target nucleic acids are NOT DETECTED.  The SARS-CoV-2 RNA is generally detectable in upper and lower respiratory specimens during the acute phase of infection. The lowest concentration of SARS-CoV-2 viral copies this assay can detect is 250 copies / mL. A negative result does not preclude SARS-CoV-2 infection and should not be used as the sole basis for treatment or other patient management decisions.  A negative result may occur with improper specimen collection / handling, submission of specimen other than nasopharyngeal swab, presence of viral mutation(s) within the areas targeted by this assay, and inadequate number of viral copies (<250 copies / mL). A negative result must be combined with clinical observations, patient history, and epidemiological information.  Fact Sheet for Patients:   BoilerBrush.com.cy  Fact Sheet for Healthcare Providers: https://pope.com/  This test is not yet approved or  cleared by the [place] FDA and has been authorized for detection and/or diagnosis of SARS-CoV-2 by FDA under an Emergency Use Authorization (EUA).  This EUA will remain in effect (meaning this test can be used) for the duration of the COVID-19 declaration under Section 564(b)(1) of the Act, 21 U.S.C. section 360bbb-3(b)(1), unless the authorization is terminated or revoked sooner.  Performed at [facility], [address], [place], [place] [address]    Aerobic/Anaerobic Culture (surgical/deep wound)     Status: None (Preliminary result)   Collection Time: 06/30/20  7:14 PM   Specimen: Abscess  Result Value Ref Range Status   Specimen Description ABSCESS  Final   Special Requests MANDIBLE  Final   Gram Stain   Final    ABUNDANT WBC PRESENT, PREDOMINANTLY PMN ABUNDANT GRAM NEGATIVE RODS MODERATE GRAM POSITIVE COCCI MODERATE GRAM POSITIVE RODS    Culture   Final    NO GROWTH < 12 HOURS Performed at [facility], [address] [place], [place] [address]    Report Status PENDING  Incomplete         Radiology Studies: CT Soft Tissue Neck W Contrast  Result Date: 06/30/2020 CLINICAL DATA:  Neck abscess, deep soft tissue swelling. Post dental. EXAM: CT NECK WITH CONTRAST TECHNIQUE: Multidetector CT imaging of the neck was  performed using the standard protocol following the bolus administration of intravenous contrast. CONTRAST:  82mL OMNIPAQUE IOHEXOL 300 MG/ML  SOLN COMPARISON:  None. FINDINGS: There is a large multiloculated fluid collection involving the right eccentric floor of mouth with mild peripheral enhancement, which may extend from the root of the recently extracted mandibular teeth anteriorly on the right. The abscess is complex, measuring up to 3.5 by 3.8 x 1.6 (AP by transverse by craniocaudal). Abscess extends inferiorly to abut the hyoid without bony destruction. Inflammatory changes track posteriorly to involve the base of tongue region and submandibular spaces with mild mass effect on the airway, which remains patent. Inflammatory changes also extend inferiorly into the subcutaneous fat, compatible cellulitis. There is also myositis of the mylohyoid. There is reactive lymphadenopathy. An airfilled tube in the subcutaneous tissues of the chin which extends outside the skin, presumably surgical. Multiple extracted teeth. Unremarkable parotid glands. Normal thyroid. Visualized upper chest is negative. Visualized  intracranial brain is grossly unremarkable. Vasculature is grossly patent. Unremarkable visualized orbits. Mild paranasal sinus mucosal thickening without air-fluid levels. Extensive multilevel degenerative change with prior ACDF. IMPRESSION: 1. Multilocular complex floor mouth abscess, likely odontogenic in origin in this patient status post recent dental extractions. Extensive associated edema extends posteriorly with mild mass effect on the oropharynx. Associated myositis of the mylohyoid and surrounding cellulitis. 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 2. An airfilled tube in the subcutaneous tissues of the chin which extends outside the skin, presumably surgical. Recommend correlation with direct inspection. 3. Reactive lymphadenopathy. Findings discussed with Dr. Jodi Mourning at 2:44 p.m. via telephone. Electronically Signed   By: Feliberto Harts MD   On: 06/30/2020 14:45        Scheduled Meds: . aspirin EC  81 mg Oral Daily  . chlorhexidine  15 mL Mouth/Throat NOW  . Chlorhexidine Gluconate Cloth  6 each Topical Once   And  . Chlorhexidine Gluconate Cloth  6 each Topical Once  . enoxaparin (LOVENOX) injection  40 mg Subcutaneous Q24H  . gabapentin  300 mg Oral TID  . insulin aspart  0-9 Units Subcutaneous Q4H  . latanoprost  1 drop Both Eyes QHS  . lisinopril  2.5 mg Oral Daily  . potassium chloride  40 mEq Oral Once  . pravastatin  20 mg Oral Daily  . vitamin B-12  1,000 mcg Oral Daily   Continuous Infusions: . ampicillin-sulbactam (UNASYN) IV 3 g (07/01/20 0902)  . lactated ringers 10 mL/hr at 06/30/20 1813  . vancomycin       LOS: 1 day   Time spent: Greater than 50% of this time was spent in counseling, explanation of diagnosis, planning of further management, and coordination of care.  I have personally reviewed and interpreted on  07/01/2020 daily labs, tele strips, I reviewed all nursing notes, pharmacy notes,  consultant notes,  vitals, pertinent old records  I have discussed plan of care as described above with RN , patient and family on 07/01/2020  Voice Recognition /Dragon dictation system was used to create this note, attempts have been made to correct errors. Please contact the author with questions and/or clarifications.   Albertine Grates, MD PhD FACP [facility]  Available via Epic secure chat 7am-7pm for nonurgent issues Please page for urgent issues To page the attending provider between 7A-7P or the covering provider during after hours 7P-7A, please log into the web site [email or web address] and access using universal [place] password for that web site.  If you do not have the password, please call the hospital operator.    07/01/2020, 9:48 AM

## 2020-07-01 NOTE — Progress Notes (Signed)
Patient did not void since before her surgery.  Patient stated she did not have the urge and when she tried to void, nothing happened.  A bladder scan showed 553 cc of urine in the bladder.  The nurse informed the on call hospitalist, who ordered a one time in and out catheterization and to do a bladder scan every four hours.  About 700 cc was removed from the in and out cath, and no residual was found on the scanner after wards.  Will continue to monitor.  Harriet Masson, RN

## 2020-07-02 ENCOUNTER — Inpatient Hospital Stay (HOSPITAL_COMMUNITY): Payer: BC Managed Care – PPO

## 2020-07-02 DIAGNOSIS — D72829 Elevated white blood cell count, unspecified: Secondary | ICD-10-CM | POA: Diagnosis not present

## 2020-07-02 DIAGNOSIS — K122 Cellulitis and abscess of mouth: Secondary | ICD-10-CM | POA: Diagnosis not present

## 2020-07-02 DIAGNOSIS — E872 Acidosis: Secondary | ICD-10-CM | POA: Diagnosis not present

## 2020-07-02 DIAGNOSIS — E119 Type 2 diabetes mellitus without complications: Secondary | ICD-10-CM | POA: Diagnosis not present

## 2020-07-02 LAB — CBC WITH DIFFERENTIAL/PLATELET
Abs Immature Granulocytes: 0.11 10*3/uL — ABNORMAL HIGH (ref 0.00–0.07)
Basophils Absolute: 0.1 10*3/uL (ref 0.0–0.1)
Basophils Relative: 0 %
Eosinophils Absolute: 0.1 10*3/uL (ref 0.0–0.5)
Eosinophils Relative: 0 %
HCT: 30.2 % — ABNORMAL LOW (ref 36.0–46.0)
Hemoglobin: 10.4 g/dL — ABNORMAL LOW (ref 12.0–15.0)
Immature Granulocytes: 1 %
Lymphocytes Relative: 25 %
Lymphs Abs: 4.5 10*3/uL — ABNORMAL HIGH (ref 0.7–4.0)
MCH: 24.8 pg — ABNORMAL LOW (ref 26.0–34.0)
MCHC: 34.4 g/dL (ref 30.0–36.0)
MCV: 71.9 fL — ABNORMAL LOW (ref 80.0–100.0)
Monocytes Absolute: 1.5 10*3/uL — ABNORMAL HIGH (ref 0.1–1.0)
Monocytes Relative: 8 %
Neutro Abs: 12.2 10*3/uL — ABNORMAL HIGH (ref 1.7–7.7)
Neutrophils Relative %: 66 %
Platelets: 286 10*3/uL (ref 150–400)
RBC: 4.2 MIL/uL (ref 3.87–5.11)
RDW: 13.5 % (ref 11.5–15.5)
WBC: 18.4 10*3/uL — ABNORMAL HIGH (ref 4.0–10.5)
nRBC: 0 % (ref 0.0–0.2)

## 2020-07-02 LAB — BASIC METABOLIC PANEL
Anion gap: 9 (ref 5–15)
BUN: 16 mg/dL (ref 8–23)
CO2: 23 mmol/L (ref 22–32)
Calcium: 8.8 mg/dL — ABNORMAL LOW (ref 8.9–10.3)
Chloride: 110 mmol/L (ref 98–111)
Creatinine, Ser: 0.63 mg/dL (ref 0.44–1.00)
GFR, Estimated: >60 mL/min (ref >60)
Glucose, Bld: 132 mg/dL — ABNORMAL HIGH (ref 70–99)
Potassium: 3.6 mmol/L (ref 3.5–5.1)
Sodium: 142 mmol/L (ref 135–145)

## 2020-07-02 LAB — GLUCOSE, CAPILLARY
Glucose-Capillary: 126 mg/dL — ABNORMAL HIGH (ref 70–99)
Glucose-Capillary: 133 mg/dL — ABNORMAL HIGH (ref 70–99)
Glucose-Capillary: 130 mg/dL — ABNORMAL HIGH (ref 70–99)
Glucose-Capillary: 128 mg/dL — ABNORMAL HIGH (ref 70–99)
Glucose-Capillary: 162 mg/dL — ABNORMAL HIGH (ref 70–99)
Glucose-Capillary: 190 mg/dL — ABNORMAL HIGH (ref 70–99)

## 2020-07-02 LAB — MAGNESIUM: Magnesium: 1.7 mg/dL (ref 1.7–2.4)

## 2020-07-02 MED ORDER — IOHEXOL 300 MG/ML  SOLN
75.0000 mL | Freq: Once | INTRAMUSCULAR | Status: AC | PRN
  Administered 2020-07-02: 75 mL via INTRAVENOUS

## 2020-07-02 MED ORDER — DEXAMETHASONE SODIUM PHOSPHATE 10 MG/ML IJ SOLN
10.0000 mg | Freq: Two times a day (BID) | INTRAMUSCULAR | Status: AC
  Administered 2020-07-02 – 2020-07-03 (×2): 10 mg via INTRAVENOUS
  Filled 2020-07-02 (×2): qty 1

## 2020-07-02 MED ORDER — MAGNESIUM SULFATE 2 GM/50ML IV SOLN
2.0000 g | Freq: Once | INTRAVENOUS | Status: AC
  Administered 2020-07-02: 2 g via INTRAVENOUS
  Filled 2020-07-02: qty 50

## 2020-07-02 MED ORDER — SODIUM CHLORIDE 0.9 % IV SOLN: INTRAVENOUS | Status: AC

## 2020-07-02 MED ORDER — HYDRALAZINE HCL 10 MG PO TABS: 10.0000 mg | ORAL_TABLET | Freq: Three times a day (TID) | ORAL | Status: DC | PRN

## 2020-07-02 NOTE — Progress Notes (Addendum)
HPI: Regina Peterson is an 65 y.o. female with h/o HTN, DM2 developed facial swelling secondary to a dental abscess over a week ago. She was seen by Dr. Thea Gist in the office on 06/26/20 at which time she had her remaining mandibular teeth extracted and an incision & drainage of the submental space performed. She was discharged on Clindamycin and since the procedure she reports persistent swelling of the area and dysphagia that has not gotten significantly better or worse. She denies any dyspnea, trismus, or fever. She went to OR on 06/30/20 for I&D of submandibular, submental, and sublingual spaces and has returned to the floor and is on IV Unasyn q6hr.   Interval History 2/5 - Patient reports that she feels slight better than she did before surgery though feels like her tongue is fairly swollen and it is affecting her speech. Has been able to swallow with mild difficulty. No dyspnea.   2/6 - Patient reports she is about the same today as she was yesterday (no worse, no better). However she mentions that she feels like her tongue is more swollen and she does at times have a little bit more difficulty swallowing and occasionally a harder time breathing when supine. WBC down from 20 to 18. Clinically looks about the same, submental penrose still draining purulence.  Lab Results Last 48 Hours        Results for orders placed or performed during the hospital encounter of 06/30/20 (from the past 48 hour(s))  Basic metabolic panel     Status: Abnormal   Collection Time: 06/30/20 12:25 PM  Result Value Ref Range   Sodium 139 135 - 145 mmol/L   Potassium 4.7 3.5 - 5.1 mmol/L    Comment: SLIGHT HEMOLYSIS   Chloride 105 98 - 111 mmol/L   CO2 21 (L) 22 - 32 mmol/L   Glucose, Bld 128 (H) 70 - 99 mg/dL    Comment: Glucose reference range applies only to samples taken after fasting for at least 8 hours.   BUN 19 8 - 23 mg/dL   Creatinine, Ser 8.36 0.44 - 1.00 mg/dL   Calcium 62.9 8.9 - 47.6  mg/dL   GFR, Estimated >54 >65 mL/min    Comment: (NOTE) Calculated using the CKD-EPI Creatinine Equation (2021)    Anion gap 13 5 - 15    Comment: Performed at [facility], [address], [place], [place] [address]  CBC with Differential     Status: Abnormal   Collection Time: 06/30/20 12:25 PM  Result Value Ref Range   WBC 15.1 (H) 4.0 - 10.5 K/uL   RBC 5.45 (H) 3.87 - 5.11 MIL/uL   Hemoglobin 12.8 12.0 - 15.0 g/dL   HCT 56.8 12.7 - 51.7 %   MCV 74.3 (L) 80.0 - 100.0 fL   MCH 23.5 (L) 26.0 - 34.0 pg   MCHC 31.6 30.0 - 36.0 g/dL   RDW 00.1 74.9 - 44.9 %   Platelets 329 150 - 400 K/uL   nRBC 0.0 0.0 - 0.2 %   Neutrophils Relative % 73 %   Neutro Abs 11.2 (H) 1.7 - 7.7 K/uL   Lymphocytes Relative 13 %   Lymphs Abs 2.0 0.7 - 4.0 K/uL   Monocytes Relative 12 %   Monocytes Absolute 1.8 (H) 0.1 - 1.0 K/uL   Eosinophils Relative 1 %   Eosinophils Absolute 0.1 0.0 - 0.5 K/uL   Basophils Relative 0 %   Basophils Absolute 0.1 0.0 - 0.1 K/uL  Immature Granulocytes 1 %   Abs Immature Granulocytes 0.07 0.00 - 0.07 K/uL    Comment: Performed at [facility], [address] [place], [place] [address]       Imaging Results (Last 48 hours)  CT Soft Tissue Neck W Contrast  Result Date: 06/30/2020 CLINICAL DATA:  Neck abscess, deep soft tissue swelling. Post dental. EXAM: CT NECK WITH CONTRAST TECHNIQUE: Multidetector CT imaging of the neck was performed using the standard protocol following the bolus administration of intravenous contrast. CONTRAST:  62mL OMNIPAQUE IOHEXOL 300 MG/ML  SOLN COMPARISON:  None. FINDINGS: There is a large multiloculated fluid collection involving the right eccentric floor of mouth with mild peripheral enhancement, which may extend from the root of the recently extracted mandibular teeth anteriorly on the right. The abscess is complex, measuring up to 3.5 by 3.8 x 1.6 (AP by transverse by craniocaudal). Abscess  extends inferiorly to abut the hyoid without bony destruction. Inflammatory changes track posteriorly to involve the base of tongue region and submandibular spaces with mild mass effect on the airway, which remains patent. Inflammatory changes also extend inferiorly into the subcutaneous fat, compatible cellulitis. There is also myositis of the mylohyoid. There is reactive lymphadenopathy. An airfilled tube in the subcutaneous tissues of the chin which extends outside the skin, presumably surgical. Multiple extracted teeth. Unremarkable parotid glands. Normal thyroid. Visualized upper chest is negative. Visualized intracranial brain is grossly unremarkable. Vasculature is grossly patent. Unremarkable visualized orbits. Mild paranasal sinus mucosal thickening without air-fluid levels. Extensive multilevel degenerative change with prior ACDF. IMPRESSION: 1. Multilocular complex floor mouth abscess, likely odontogenic in origin in this patient status post recent dental extractions. Extensive associated edema extends posteriorly with mild mass effect on the oropharynx. Associated myositis of the mylohyoid and surrounding cellulitis. 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 2. An airfilled tube in the subcutaneous tissues of the chin which extends outside the skin, presumably surgical. Recommend correlation with direct inspection. 3. Reactive lymphadenopathy. Findings discussed with Dr. Jodi Mourning at 2:44 p.m. via telephone. Electronically Signed   By: Feliberto Harts MD   On: 06/30/2020 14:45     Review of Systems  HENT: Positive for dental problem, facial swelling, sore throat and trouble swallowing.   Respiratory: Negative.   Psychiatric/Behavioral: Negative.    Blood pressure (!) 178/89, pulse (!) 111, temperature 98.7 F (37.1 C), temperature source Oral, resp. rate 16, SpO2 99 %. Physical Exam HENT:     Head:     Jaw: Tenderness and swelling present.      Mouth/Throat: Oropharyx is clear    Dentition: Dental abscesses present. Edentulous maxilla and recently edentulated mandible with healing extraction sockets.    Palate: No mass.     Pharynx: Oropharynx is clear. Uvula midline.    Extraoral - appreciable facial swelling in the submental and bilateral submandibular regions (R>>L), inferior border of the mandible not palpable. Penrose drains in place with purulent drainage expressed from a submental drain, no drainage from other drains.. No trismus with MIO 40+ mm.   Intraoral - recent extraction sockets and incisions approximated/close intraorally, no vestibular swelling however FOM is erythematous/edematous bilaterally (R>L) and tongue is elevated; oropharynx is clear w/o palatal draping.   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 Her condition is stable and she is on IV Unasyn q6hr. WBC bumped to  20 2/2 surgery and steroids likely.    OMFS Recommendations -CT MAXILLOFACIAL W/ CONTRAST today STAT given reported dyspnea; patient instructed to remain NPO until CT is done in case she needs to go back to the OR  -- CT reviewed - airway is patent and drains are in correct location; recommend clear liquid diet only should her clinical condition worsen, do not plan to take back to OR today given CT findings;  -Decadron 10 mg x 2 (ordered) to help with edema -Peridex (chlorohexidine) mouthrnise QID -Change Kerlix dressing as needed -Continue Unasyn 3g q6hr -Follow cultures -Daily CBC w/ diff  Please call me with questions, updates or concerns @ [phone].  Enis Slipper, DDS 06/30/2020, 3:35 PM

## 2020-07-02 NOTE — Progress Notes (Signed)
HPI: Regina Peterson is an 65 y.o. female with h/o HTN, DM2 developed facial swelling secondary to a dental abscess over a week ago. She was seen by Dr. Thea Gist in the office on 06/26/20 at which time she had her remaining mandibular teeth extracted and an incision & drainage of the submental space performed. She was discharged on Clindamycin and since the procedure she reports persistent swelling of the area and dysphagia that has not gotten significantly better or worse. She denies any dyspnea, trismus, or fever. She went to OR on 06/30/20 for I&D of submandibular, submental, and sublingual spaces and has returned to the floor and is on IV Unasyn q6hr.   Interval History 2/5 - Patient reports that she feels slight better than she did before surgery though feels like her tongue is fairly swollen and it is affecting her speech. Has been able to swallow with mild difficulty. No dyspnea.   Lab Results Last 48 Hours        Results for orders placed or performed during the hospital encounter of 06/30/20 (from the past 48 hour(s))  Basic metabolic panel     Status: Abnormal   Collection Time: 06/30/20 12:25 PM  Result Value Ref Range   Sodium 139 135 - 145 mmol/L   Potassium 4.7 3.5 - 5.1 mmol/L    Comment: SLIGHT HEMOLYSIS   Chloride 105 98 - 111 mmol/L   CO2 21 (L) 22 - 32 mmol/L   Glucose, Bld 128 (H) 70 - 99 mg/dL    Comment: Glucose reference range applies only to samples taken after fasting for at least 8 hours.   BUN 19 8 - 23 mg/dL   Creatinine, Ser 1.06 0.44 - 1.00 mg/dL   Calcium 26.9 8.9 - 48.5 mg/dL   GFR, Estimated >46 >27 mL/min    Comment: (NOTE) Calculated using the CKD-EPI Creatinine Equation (2021)    Anion gap 13 5 - 15    Comment: Performed at [facility], [address] [place], [place] [address]  CBC with Differential     Status: Abnormal   Collection Time: 06/30/20 12:25 PM  Result Value Ref Range   WBC 15.1 (H) 4.0 - 10.5 K/uL   RBC  5.45 (H) 3.87 - 5.11 MIL/uL   Hemoglobin 12.8 12.0 - 15.0 g/dL   HCT 93.8 18.2 - 99.3 %   MCV 74.3 (L) 80.0 - 100.0 fL   MCH 23.5 (L) 26.0 - 34.0 pg   MCHC 31.6 30.0 - 36.0 g/dL   RDW 71.6 96.7 - 89.3 %   Platelets 329 150 - 400 K/uL   nRBC 0.0 0.0 - 0.2 %   Neutrophils Relative % 73 %   Neutro Abs 11.2 (H) 1.7 - 7.7 K/uL   Lymphocytes Relative 13 %   Lymphs Abs 2.0 0.7 - 4.0 K/uL   Monocytes Relative 12 %   Monocytes Absolute 1.8 (H) 0.1 - 1.0 K/uL   Eosinophils Relative 1 %   Eosinophils Absolute 0.1 0.0 - 0.5 K/uL   Basophils Relative 0 %   Basophils Absolute 0.1 0.0 - 0.1 K/uL   Immature Granulocytes 1 %   Abs Immature Granulocytes 0.07 0.00 - 0.07 K/uL    Comment: Performed at [facility], [address], [place], [place] [address]       Imaging Results (Last 48 hours)  CT Soft Tissue Neck W Contrast  Result Date: 06/30/2020 CLINICAL DATA:  Neck abscess, deep soft tissue swelling. Post dental. EXAM:  CT NECK WITH CONTRAST TECHNIQUE: Multidetector CT imaging of the neck was performed using the standard protocol following the bolus administration of intravenous contrast. CONTRAST:  77mL OMNIPAQUE IOHEXOL 300 MG/ML  SOLN COMPARISON:  None. FINDINGS: There is a large multiloculated fluid collection involving the right eccentric floor of mouth with mild peripheral enhancement, which may extend from the root of the recently extracted mandibular teeth anteriorly on the right. The abscess is complex, measuring up to 3.5 by 3.8 x 1.6 (AP by transverse by craniocaudal). Abscess extends inferiorly to abut the hyoid without bony destruction. Inflammatory changes track posteriorly to involve the base of tongue region and submandibular spaces with mild mass effect on the airway, which remains patent. Inflammatory changes also extend inferiorly into the subcutaneous fat, compatible cellulitis. There is also myositis of the mylohyoid. There is reactive lymphadenopathy.  An airfilled tube in the subcutaneous tissues of the chin which extends outside the skin, presumably surgical. Multiple extracted teeth. Unremarkable parotid glands. Normal thyroid. Visualized upper chest is negative. Visualized intracranial brain is grossly unremarkable. Vasculature is grossly patent. Unremarkable visualized orbits. Mild paranasal sinus mucosal thickening without air-fluid levels. Extensive multilevel degenerative change with prior ACDF. IMPRESSION: 1. Multilocular complex floor mouth abscess, likely odontogenic in origin in this patient status post recent dental extractions. Extensive associated edema extends posteriorly with mild mass effect on the oropharynx. Associated myositis of the mylohyoid and surrounding cellulitis. 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 2. An airfilled tube in the subcutaneous tissues of the chin which extends outside the skin, presumably surgical. Recommend correlation with direct inspection. 3. Reactive lymphadenopathy. Findings discussed with Dr. Jodi Mourning at 2:44 p.m. via telephone. Electronically Signed   By: Feliberto Harts MD   On: 06/30/2020 14:45     Review of Systems  HENT: Positive for dental problem, facial swelling, sore throat and trouble swallowing.   Respiratory: Negative.   Psychiatric/Behavioral: Negative.    Blood pressure (!) 178/89, pulse (!) 111, temperature 98.7 F (37.1 C), temperature source Oral, resp. rate 16, SpO2 99 %. Physical Exam HENT:     Head:     Jaw: Tenderness and swelling present.     Mouth/Throat: Oropharyx is clear    Dentition: Dental abscesses present. Edentulous maxilla and recently edentulated mandible with healing extraction sockets.    Palate: No mass.     Pharynx: Oropharynx is clear. Uvula midline.    Extraoral - appreciable facial swelling in the submental and bilateral submandibular regions (R>>L), inferior border of the mandible not palpable. Penrose  drains in place with purulent drainage expressed from a submental drain, no drainage from other drains.. No trismus with MIO 40+ mm.   Intraoral - recent extraction sockets and incisions approximated/close intraorally, no vestibular swelling however FOM is erythematous/edematous bilaterally (R>L) and tongue is elevated; oropharynx is clear w/o palatal draping.   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 Her condition is stable and she is on IV Unasyn q6hr. WBC bumped to 20 2/2 surgery and steroids likely.    OMFS Recommendations -Soft diet -Peridex (chlorohexidine) mouthrnise QID -Change Kerlix dressing as needed -Continue Unasyn 3g q6hr -Follow cultures -Daily CBC w/ diff  Damira Kem J Jonella Redditt 06/30/2020, 3:35 PM

## 2020-07-02 NOTE — Progress Notes (Addendum)
PROGRESS NOTE    Bernett Raysor  PHX:[id number] DOB: [birth year] DOA: 06/30/2020 PCP: Patient, No Pcp Per    Chief Complaint  Patient presents with  . Facial Swelling    Brief Narrative:  Charnetta Sabic is a 65 y.o. female with medical history significant of non-insulin-dependent type 2 diabetes, hypertension, hyperlipidemia, sent by oral surgeon for further evaluation of lower drawl and neck swelling  CT soft tissue neck showed "Multilocular complex floor mouth abscess  Subjective:  Reports pain when she swallows, feeling tongue is swollen no sob, no fever Husband  at bedside   Assessment & Plan:   Active Problems:   Angina, Suella Broad angina -she received iv van in the ED, -Continue Zosyn  - Status post I&D of bilateral sublingual abscess, submental abscess, bilateral submandibular abscess on 2/4 pm  -oral surgery started decadron  on 2/6  Due to swollen tongue , STAT CT maxillofacial did not show airway compromise, drain in correct location -Management per oral surgery  Leukocytosis -WBC significant jumped from 8 - 20.7-18.4 -Continue antibiotics, hydration,  - blood culture no growth and wound culture rare Streptococcus Constellatus -Repeat CBC  Lactic acidosis Lactic acid peaked at 2.1 Resolved after hydration  Noninsulin-dependent type 2 diabetes, fairly controlled check A1c 7% Hold Metformin Start SSI  Hypertension  renal function stable  resume lisinopril  bp elevated likely due to pain and stress, start hydralazine prn  Hyperlipidemia continue Pravachol   Postop urinary retention -700 cc removed after urine and out cath -Increase activity, monitor bladder scan   Hypokalemia/hypomagnesemia Replace K, mag  DVT prophylaxis: enoxaparin (LOVENOX) injection 40 mg Start: 07/01/20 1000 SCDs Start: 06/30/20 2129 SCD's Start: 06/30/20 1540   Code Status:full Family Communication: Daughter at bedside Disposition:   Status is:  Inpatient   Dispo: The patient is from: Home              Anticipated d/c is to: Home              Anticipated d/c date is: When cleared by oral surgery                Consultants:   Oral surgery  Procedures:   I&D of mouth floor abscess  Antimicrobials:   Vanc on 2/4  zosyn from 2/4     Objective: Vitals:   07/02/20 0413 07/02/20 0727 07/02/20 1200 07/02/20 1554  BP: (!) 145/67 (!) 166/91 (!) 136/92 (!) 161/81  Pulse: 87 87 88 85  Resp: 18 17 20 18   Temp: 98.6 F (37 C) 98.5 F (36.9 C) 98.3 F (36.8 C) 98.1 F (36.7 C)  TempSrc: Oral Oral Oral Oral  SpO2: 98% 99% 99% 97%  Weight:      Height:        Intake/Output Summary (Last 24 hours) at 07/02/2020 1721 Last data filed at 07/02/2020 1501 Gross per 24 hour  Intake --  Output 1075 ml  Net -1075 ml   Filed Weights   06/30/20 2126  Weight: 63.1 kg    Examination:  General exam: calm, NAD, submandible dressing , + drain Respiratory system: Clear to auscultation. Respiratory effort normal. Cardiovascular system: S1 & S2 heard, RRR. No JVD, no murmur, No pedal edema. Gastrointestinal system: Abdomen is nondistended, soft and nontender. Normal bowel sounds heard. Central nervous system: Alert and oriented. No focal neurological deficits. Extremities: Symmetric 5 x 5 power. Skin: No rashes, lesions or ulcers Psychiatry: Judgement and insight appear normal. Mood & affect appropriate.  Data Reviewed: I have personally reviewed following labs and imaging studies  CBC: Recent Labs  Lab 06/30/20 1225 06/30/20 2141 07/01/20 0043 07/02/20 0227  WBC 15.1* 8.3 20.7* 18.4*  NEUTROABS 11.2*  --   --  12.2*  HGB 12.8 12.3 10.8* 10.4*  HCT 40.5 38.6 31.8* 30.2*  MCV 74.3* 73.9* 72.4* 71.9*  PLT 329 270 260 286    Basic Metabolic Panel: Recent Labs  Lab 06/30/20 1225 06/30/20 2141 07/01/20 0043 07/02/20 0227  NA 139  --  140 142  K 4.7  --  3.3* 3.6  CL 105  --  109 110  CO2 21*  --  19* 23   GLUCOSE 128*  --  128* 132*  BUN 19  --  16 16  CREATININE 0.84 0.90 0.84 0.63  CALCIUM 10.1  --  8.7* 8.8*  MG  --   --   --  1.7    GFR: Estimated Creatinine Clearance: 60.4 mL/min (by C-G formula based on SCr of 0.63 mg/dL).  Liver Function Tests: Recent Labs  Lab 07/01/20 0043  AST 24  ALT 15  ALKPHOS 94  BILITOT 0.8  PROT 6.2*  ALBUMIN 2.4*    CBG: Recent Labs  Lab 07/01/20 2310 07/02/20 0426 07/02/20 0731 07/02/20 1051 07/02/20 1556  GLUCAP 140* 126* 133* 130* 128*     Recent Results (from the past 240 hour(s))  SARS Coronavirus 2 by RT PCR (hospital order, performed in [facility] hospital lab) Nasopharyngeal Nasopharyngeal Swab     Status: None   Collection Time: 06/30/20  2:49 PM   Specimen: Nasopharyngeal Swab  Result Value Ref Range Status   SARS Coronavirus 2 NEGATIVE NEGATIVE Final    Comment: (NOTE) SARS-CoV-2 target nucleic acids are NOT DETECTED.  The SARS-CoV-2 RNA is generally detectable in upper and lower respiratory specimens during the acute phase of infection. The lowest concentration of SARS-CoV-2 viral copies this assay can detect is 250 copies / mL. A negative result does not preclude SARS-CoV-2 infection and should not be used as the sole basis for treatment or other patient management decisions.  A negative result may occur with improper specimen collection / handling, submission of specimen other than nasopharyngeal swab, presence of viral mutation(s) within the areas targeted by this assay, and inadequate number of viral copies (<250 copies / mL). A negative result must be combined with clinical observations, patient history, and epidemiological information.  Fact Sheet for Patients:   BoilerBrush.com.cy  Fact Sheet for Healthcare Providers: https://pope.com/  This test is not yet approved or  cleared by the [place] FDA and has been authorized for detection and/or  diagnosis of SARS-CoV-2 by FDA under an Emergency Use Authorization (EUA).  This EUA will remain in effect (meaning this test can be used) for the duration of the COVID-19 declaration under Section 564(b)(1) of the Act, 21 U.S.C. section 360bbb-3(b)(1), unless the authorization is terminated or revoked sooner.  Performed at [facility], [address] [place], [place] [address]   Blood culture (routine x 2)     Status: None (Preliminary result)   Collection Time: 06/30/20  4:21 PM   Specimen: BLOOD RIGHT HAND  Result Value Ref Range Status   Specimen Description BLOOD RIGHT HAND  Final   Special Requests   Final    BOTTLES DRAWN AEROBIC AND ANAEROBIC Blood Culture adequate volume   Culture   Final    NO GROWTH 2 DAYS Performed at [facility], [address] [place], [place] [address]    Report Status PENDING  Incomplete  Blood culture (routine x 2)     Status: None (Preliminary result)   Collection Time: 06/30/20  4:25 PM   Specimen: BLOOD LEFT HAND  Result Value Ref Range Status   Specimen Description BLOOD LEFT HAND  Final   Special Requests   Final    BOTTLES DRAWN AEROBIC AND ANAEROBIC Blood Culture adequate volume   Culture   Final    NO GROWTH 2 DAYS Performed at [facility], [address], [place], [place] [address]    Report Status PENDING  Incomplete  Aerobic/Anaerobic Culture (surgical/deep wound)     Status: None (Preliminary result)   Collection Time: 06/30/20  7:14 PM   Specimen: Abscess  Result Value Ref Range Status   Specimen Description ABSCESS  Final   Special Requests MANDIBLE  Final   Gram Stain   Final    ABUNDANT WBC PRESENT, PREDOMINANTLY PMN ABUNDANT GRAM NEGATIVE RODS MODERATE GRAM POSITIVE COCCI MODERATE GRAM POSITIVE RODS Performed at [facility], [address], [place], [place] [address]    Culture   Final    RARE STREPTOCOCCUS CONSTELLATUS NO ANAEROBES ISOLATED; CULTURE IN PROGRESS FOR 5 DAYS    Report  Status PENDING  Incomplete  MRSA PCR Screening     Status: None   Collection Time: 07/01/20  3:18 PM   Specimen: Nasal Mucosa; Nasopharyngeal  Result Value Ref Range Status   MRSA by PCR NEGATIVE NEGATIVE Final    Comment:        The GeneXpert MRSA Assay (FDA approved for NASAL specimens only), is one component of a comprehensive MRSA colonization surveillance program. It is not intended to diagnose MRSA infection nor to guide or monitor treatment for MRSA infections. Performed at [facility], [address], [place], [place] [address]          Radiology Studies: CT MAXILLOFACIAL W CONTRAST  Result Date: 07/02/2020 CLINICAL DATA:  Cellulitis of the face. EXAM: CT MAXILLOFACIAL WITH CONTRAST TECHNIQUE: Multidetector CT imaging of the maxillofacial structures was performed with intravenous contrast. Multiplanar CT image reconstructions were also generated. CONTRAST:  79mL OMNIPAQUE IOHEXOL 300 MG/ML  SOLN COMPARISON:  CT of the neck June 30, 2018 FINDINGS: There are right submental and right submandibular transcutaneous drains in place. Status post drain is of the previously seen floor mouth abscess. There is a small residual ill-defined fluid collection extending from the anterior right submandibular gland along right mylohyoid anteriorly, which measures up to approximately 8 x 33 x 14 mm (see series 3, image 18 and series 7, image 50). There is another small ill-defined fluid collection immediately anterior to the hyoid bone which measures approximately 1 cm (see series 3, image 10) and extends anteriorly to the submental drain. There is surrounding soft tissue edema and subcutaneous stranding of the floor mouth, tongue and overlying subcutaneous soft tissues, compatible with cellulitis and or postoperative change. Mild mass effect on the adjacent oropharyngeal airway. Parotid glands are unremarkable. Submandibular glands are likely mildly inflamed, but appear symmetric. Status  post dental extractions. Mild scattered ethmoid air cell opacification. Mastoid air cells are clear. No overt intracranial abnormality in the visualized brain. Visualized vasculature is grossly patent. Partially imaged upper cervical degenerative change with ACDF spanning C3 through C5. Orbits are unremarkable. IMPRESSION: 1. Status post incision and drainage of the right floor of mouth abscess with placement of two drains. The previously described  abscess is significantly improved with two residual ill-defined areas of fluid, one extending form the anterior right submandibular gland along the right mylohyoid anteriorly and the other anterior to the hyoid bone extending anteriorly to the submental drain. These areas of fluid likely communicate. 2. Surrounding edema and fat stranding likely represents a combination of cellulitis and postsurgical change. Mild mass effect on the adjacent oropharyngeal airway. Electronically Signed   By: Feliberto Harts MD   On: 07/02/2020 13:20        Scheduled Meds: . aspirin EC  81 mg Oral Daily  . Chlorhexidine Gluconate Cloth  6 each Topical Once   And  . Chlorhexidine Gluconate Cloth  6 each Topical Once  . dexamethasone (DECADRON) injection  10 mg Intravenous Q12H  . enoxaparin (LOVENOX) injection  40 mg Subcutaneous Q24H  . gabapentin  300 mg Oral TID  . insulin aspart  0-9 Units Subcutaneous Q4H  . latanoprost  1 drop Both Eyes QHS  . lisinopril  2.5 mg Oral Daily  . pravastatin  20 mg Oral Daily  . senna-docusate  1 tablet Oral BID  . vitamin B-12  1,000 mcg Oral Daily   Continuous Infusions: . ampicillin-sulbactam (UNASYN) IV 3 g (07/02/20 1720)     LOS: 2 days   Time spent: Greater than 50% of this time was spent in counseling, explanation of diagnosis, planning of further management, and coordination of care.   Voice Recognition Reubin Milan dictation system was used to create this note, attempts have been made to correct errors. Please  contact the author with questions and/or clarifications.   Albertine Grates, MD PhD FACP [facility]  Available via Epic secure chat 7am-7pm for nonurgent issues Please page for urgent issues To page the attending provider between 7A-7P or the covering provider during after hours 7P-7A, please log into the web site [email or web address] and access using universal [place] password for that web site. If you do not have the password, please call the hospital operator.    07/02/2020, 5:21 PM

## 2020-07-03 DIAGNOSIS — K122 Cellulitis and abscess of mouth: Principal | ICD-10-CM

## 2020-07-03 LAB — GLUCOSE, CAPILLARY
Glucose-Capillary: 151 mg/dL — ABNORMAL HIGH (ref 70–99)
Glucose-Capillary: 154 mg/dL — ABNORMAL HIGH (ref 70–99)
Glucose-Capillary: 158 mg/dL — ABNORMAL HIGH (ref 70–99)
Glucose-Capillary: 167 mg/dL — ABNORMAL HIGH (ref 70–99)
Glucose-Capillary: 174 mg/dL — ABNORMAL HIGH (ref 70–99)

## 2020-07-03 LAB — BASIC METABOLIC PANEL
Anion gap: 8 (ref 5–15)
BUN: 8 mg/dL (ref 8–23)
CO2: 26 mmol/L (ref 22–32)
Calcium: 9 mg/dL (ref 8.9–10.3)
Chloride: 105 mmol/L (ref 98–111)
Creatinine, Ser: 0.73 mg/dL (ref 0.44–1.00)
GFR, Estimated: >60 mL/min (ref >60)
Glucose, Bld: 200 mg/dL — ABNORMAL HIGH (ref 70–99)
Potassium: 4.2 mmol/L (ref 3.5–5.1)
Sodium: 139 mmol/L (ref 135–145)

## 2020-07-03 LAB — CBC
HCT: 34.1 % — ABNORMAL LOW (ref 36.0–46.0)
Hemoglobin: 11.5 g/dL — ABNORMAL LOW (ref 12.0–15.0)
MCH: 24.1 pg — ABNORMAL LOW (ref 26.0–34.0)
MCHC: 33.7 g/dL (ref 30.0–36.0)
MCV: 71.5 fL — ABNORMAL LOW (ref 80.0–100.0)
Platelets: 338 10*3/uL (ref 150–400)
RBC: 4.77 MIL/uL (ref 3.87–5.11)
RDW: 13.3 % (ref 11.5–15.5)
WBC: 12.2 10*3/uL — ABNORMAL HIGH (ref 4.0–10.5)
nRBC: 0 % (ref 0.0–0.2)

## 2020-07-03 LAB — MAGNESIUM: Magnesium: 2.1 mg/dL (ref 1.7–2.4)

## 2020-07-03 MED ORDER — DEXAMETHASONE SODIUM PHOSPHATE 10 MG/ML IJ SOLN
10.0000 mg | Freq: Once | INTRAMUSCULAR | Status: AC
  Administered 2020-07-03: 10 mg via INTRAVENOUS
  Filled 2020-07-03: qty 1

## 2020-07-03 NOTE — Progress Notes (Signed)
PROGRESS NOTE    Regina Peterson  YTK:[id number] DOB: [birth year] DOA: 06/30/2020 PCP: Patient, No Pcp Per   Chief Complain: Facial swelling  Brief Narrative: Patient is a 65 year old female with history of non-insulin-dependent diabetes type 2, hypertension, hyperlipidemia who was sent by her oral surgeon for the further evaluation of facial/neck swelling.  CT soft tissue showed multilobular complex  abscesses.  She was started on broad-spectrum antibiotics.  Underwent I&D of bilateral sublingual abscesses, submental abscess, bilateral submandibular abscess on 2/12.  Dental surgery is following.  Assessment & Plan:   Active Problems:   Angina, Suella Broad angina: Referred by her oral surgeon for the evaluation of face/neck swelling.  CT soft tissue showed multiple abscesses .Underwent I&D of bilateral sublingual abscesses, submental abscess, bilateral submandibular abscess on 2/12.  Also on steroids for edema. Overall status is improving.  Diet has been advanced to soft today.  Continue on Unasyn for now.  Dental surgery is following.  Leukocytosis: Improving.  Could also be associated with steroids. continue to monitor.  Continue antibiotics, gentle IV fluids blood cultures have not shown any growth.  Wound culture showed rare Streptococcus Constellatus.  Monitor  Lactic acidosis: Resolved  Non-insulin-dependent diabetes type 2: Fairly controlled.  Hemoglobin A1c of 7.  On Metformin at home.  Continue sliding scale insulin here. Hypoglycemia could have been contributed by steroid.  Hypertension: Currently blood pressure stable.  Lisinopril resumed.  As needed medications for severe hypertension  Hyperlipidemia: Continue home medication  Hypokalemia/hypomagnesemia: Being monitored and supplemented.         DVT prophylaxis: Lovenox Code Status: Full Family Communication: Husband at bedside Status is: Inpatient  Remains inpatient appropriate because:Inpatient level of care  appropriate due to severity of illness   Dispo: The patient is from: Home              Anticipated d/c is to: Home              Anticipated d/c date is: 2 days              Patient currently is not medically stable to d/c.   Difficult to place patient No     Consultants: Dental surgery  Procedures:I and D  Antimicrobials:  Anti-infectives (From admission, onward)   Start     Dose/Rate Route Frequency Ordered Stop   07/01/20 1600  vancomycin (VANCOCIN) IVPB 1000 mg/200 mL premix  Status:  Discontinued        1,000 mg 200 mL/hr over 60 Minutes Intravenous Every 24 hours 06/30/20 1512 07/01/20 1520   06/30/20 1515  Ampicillin-Sulbactam (UNASYN) 3 g in sodium chloride 0.9 % 100 mL IVPB        3 g 200 mL/hr over 30 Minutes Intravenous Every 6 hours 06/30/20 1503     06/30/20 1500  vancomycin (VANCOREADY) IVPB 1250 mg/250 mL        1,250 mg 166.7 mL/hr over 90 Minutes Intravenous  Once 06/30/20 1448 06/30/20 1801      Subjective: Patient seen and examined the bedside this afternoon.  Comfortable.  Hemodynamically stable.  Eating yogurt.  Pain and swelling much better.  Objective: Vitals:   07/02/20 2303 07/03/20 0330 07/03/20 0730 07/03/20 1000  BP: (!) 139/91 127/88 (!) 167/89 (!) 149/84  Pulse: 82 100 81 89  Resp: 19 20 16  (!) 21  Temp: 98.2 F (36.8 C) 98.2 F (36.8 C) 97.8 F (36.6 C) 98.2 F (36.8 C)  TempSrc: Oral Oral Oral Oral  SpO2: 96%  97% 98%  Weight:  66.9 kg    Height:        Intake/Output Summary (Last 24 hours) at 07/03/2020 1332 Last data filed at 07/03/2020 8250 Gross per 24 hour  Intake 1140 ml  Output 1550 ml  Net -410 ml   Filed Weights   06/30/20 2126 07/03/20 0330  Weight: 63.1 kg 66.9 kg    Examination:  General exam: Comfortable, pleasant female HEENT:PERRL,Oral mucosa moist, Ear/Nose normal on gross exam Drains on the submandibular area Respiratory system: Bilateral equal air entry, normal vesicular breath sounds, no wheezes or  crackles  Cardiovascular system: S1 & S2 heard, RRR. No JVD, murmurs, rubs, gallops or clicks. No pedal edema. Gastrointestinal system: Abdomen is nondistended, soft and nontender. No organomegaly or masses felt. Normal bowel sounds heard. Central nervous system: Alert and oriented.  Extremities: No edema, no clubbing ,no cyanosis Skin: No rashes, lesions or ulcers,no icterus ,no pallor   Data Reviewed: I have personally reviewed following labs and imaging studies  CBC: Recent Labs  Lab 06/30/20 1225 06/30/20 2141 07/01/20 0043 07/02/20 0227 07/03/20 0059  WBC 15.1* 8.3 20.7* 18.4* 12.2*  NEUTROABS 11.2*  --   --  12.2*  --   HGB 12.8 12.3 10.8* 10.4* 11.5*  HCT 40.5 38.6 31.8* 30.2* 34.1*  MCV 74.3* 73.9* 72.4* 71.9* 71.5*  PLT 329 270 260 286 338   Basic Metabolic Panel: Recent Labs  Lab 06/30/20 1225 06/30/20 2141 07/01/20 0043 07/02/20 0227 07/03/20 0059  NA 139  --  140 142 139  K 4.7  --  3.3* 3.6 4.2  CL 105  --  109 110 105  CO2 21*  --  19* 23 26  GLUCOSE 128*  --  128* 132* 200*  BUN 19  --  16 16 8   CREATININE 0.84 0.90 0.84 0.63 0.73  CALCIUM 10.1  --  8.7* 8.8* 9.0  MG  --   --   --  1.7 2.1   GFR: Estimated Creatinine Clearance: 62.3 mL/min (by C-G formula based on SCr of 0.73 mg/dL). Liver Function Tests: Recent Labs  Lab 07/01/20 0043  AST 24  ALT 15  ALKPHOS 94  BILITOT 0.8  PROT 6.2*  ALBUMIN 2.4*   No results for input(s): LIPASE, AMYLASE in the last 168 hours. No results for input(s): AMMONIA in the last 168 hours. Coagulation Profile: No results for input(s): INR, PROTIME in the last 168 hours. Cardiac Enzymes: No results for input(s): CKTOTAL, CKMB, CKMBINDEX, TROPONINI in the last 168 hours. BNP (last 3 results) No results for input(s): PROBNP in the last 8760 hours. HbA1C: Recent Labs    06/30/20 2141  HGBA1C 7.0*   CBG: Recent Labs  Lab 07/02/20 1556 07/02/20 2032 07/02/20 2306 07/03/20 0337 07/03/20 0749  GLUCAP  128* 162* 190* 154* 158*   Lipid Profile: No results for input(s): CHOL, HDL, LDLCALC, TRIG, CHOLHDL, LDLDIRECT in the last 72 hours. Thyroid Function Tests: No results for input(s): TSH, T4TOTAL, FREET4, T3FREE, THYROIDAB in the last 72 hours. Anemia Panel: No results for input(s): VITAMINB12, FOLATE, FERRITIN, TIBC, IRON, RETICCTPCT in the last 72 hours. Sepsis Labs: Recent Labs  Lab 06/30/20 1650 06/30/20 2141 07/01/20 0043  LATICACIDVEN 1.2 2.1* 0.9    Recent Results (from the past 240 hour(s))  SARS Coronavirus 2 by RT PCR (hospital order, performed in [facility] hospital lab) Nasopharyngeal Nasopharyngeal Swab     Status: None   Collection Time: 06/30/20  2:49 PM   Specimen: Nasopharyngeal  Swab  Result Value Ref Range Status   SARS Coronavirus 2 NEGATIVE NEGATIVE Final    Comment: (NOTE) SARS-CoV-2 target nucleic acids are NOT DETECTED.  The SARS-CoV-2 RNA is generally detectable in upper and lower respiratory specimens during the acute phase of infection. The lowest concentration of SARS-CoV-2 viral copies this assay can detect is 250 copies / mL. A negative result does not preclude SARS-CoV-2 infection and should not be used as the sole basis for treatment or other patient management decisions.  A negative result may occur with improper specimen collection / handling, submission of specimen other than nasopharyngeal swab, presence of viral mutation(s) within the areas targeted by this assay, and inadequate number of viral copies (<250 copies / mL). A negative result must be combined with clinical observations, patient history, and epidemiological information.  Fact Sheet for Patients:   BoilerBrush.com.cy  Fact Sheet for Healthcare Providers: https://pope.com/  This test is not yet approved or  cleared by the [place] FDA and has been authorized for detection and/or diagnosis of SARS-CoV-2 by FDA under an  Emergency Use Authorization (EUA).  This EUA will remain in effect (meaning this test can be used) for the duration of the COVID-19 declaration under Section 564(b)(1) of the Act, 21 U.S.C. section 360bbb-3(b)(1), unless the authorization is terminated or revoked sooner.  Performed at [facility], [address], [place], [place] [address]   Blood culture (routine x 2)     Status: None (Preliminary result)   Collection Time: 06/30/20  4:21 PM   Specimen: BLOOD RIGHT HAND  Result Value Ref Range Status   Specimen Description BLOOD RIGHT HAND  Final   Special Requests   Final    BOTTLES DRAWN AEROBIC AND ANAEROBIC Blood Culture adequate volume   Culture   Final    NO GROWTH 3 DAYS Performed at [facility], [address], [place], [place] [address]    Report Status PENDING  Incomplete  Blood culture (routine x 2)     Status: None (Preliminary result)   Collection Time: 06/30/20  4:25 PM   Specimen: BLOOD LEFT HAND  Result Value Ref Range Status   Specimen Description BLOOD LEFT HAND  Final   Special Requests   Final    BOTTLES DRAWN AEROBIC AND ANAEROBIC Blood Culture adequate volume   Culture   Final    NO GROWTH 3 DAYS Performed at [facility], [address], [place], [place] [address]    Report Status PENDING  Incomplete  Aerobic/Anaerobic Culture (surgical/deep wound)     Status: None (Preliminary result)   Collection Time: 06/30/20  7:14 PM   Specimen: Abscess  Result Value Ref Range Status   Specimen Description ABSCESS  Final   Special Requests MANDIBLE  Final   Gram Stain   Final    ABUNDANT WBC PRESENT, PREDOMINANTLY PMN ABUNDANT GRAM NEGATIVE RODS MODERATE GRAM POSITIVE COCCI MODERATE GRAM POSITIVE RODS Performed at [facility], [address], [place], [place] [address]    Culture   Final    RARE STREPTOCOCCUS CONSTELLATUS NO ANAEROBES ISOLATED; CULTURE IN PROGRESS FOR 5 DAYS    Report Status PENDING  Incomplete  MRSA PCR  Screening     Status: None   Collection Time: 07/01/20  3:18 PM   Specimen: Nasal Mucosa; Nasopharyngeal  Result Value Ref Range Status   MRSA by PCR NEGATIVE NEGATIVE Final    Comment:        The  GeneXpert MRSA Assay (FDA approved for NASAL specimens only), is one component of a comprehensive MRSA colonization surveillance program. It is not intended to diagnose MRSA infection nor to guide or monitor treatment for MRSA infections. Performed at [facility], [address] [place], [place] [address]          Radiology Studies: CT MAXILLOFACIAL W CONTRAST  Result Date: 07/02/2020 CLINICAL DATA:  Cellulitis of the face. EXAM: CT MAXILLOFACIAL WITH CONTRAST TECHNIQUE: Multidetector CT imaging of the maxillofacial structures was performed with intravenous contrast. Multiplanar CT image reconstructions were also generated. CONTRAST:  43mL OMNIPAQUE IOHEXOL 300 MG/ML  SOLN COMPARISON:  CT of the neck June 30, 2018 FINDINGS: There are right submental and right submandibular transcutaneous drains in place. Status post drain is of the previously seen floor mouth abscess. There is a small residual ill-defined fluid collection extending from the anterior right submandibular gland along right mylohyoid anteriorly, which measures up to approximately 8 x 33 x 14 mm (see series 3, image 18 and series 7, image 50). There is another small ill-defined fluid collection immediately anterior to the hyoid bone which measures approximately 1 cm (see series 3, image 10) and extends anteriorly to the submental drain. There is surrounding soft tissue edema and subcutaneous stranding of the floor mouth, tongue and overlying subcutaneous soft tissues, compatible with cellulitis and or postoperative change. Mild mass effect on the adjacent oropharyngeal airway. Parotid glands are unremarkable. Submandibular glands are likely mildly inflamed, but appear symmetric. Status post dental extractions. Mild scattered  ethmoid air cell opacification. Mastoid air cells are clear. No overt intracranial abnormality in the visualized brain. Visualized vasculature is grossly patent. Partially imaged upper cervical degenerative change with ACDF spanning C3 through C5. Orbits are unremarkable. IMPRESSION: 1. Status post incision and drainage of the right floor of mouth abscess with placement of two drains. The previously described abscess is significantly improved with two residual ill-defined areas of fluid, one extending form the anterior right submandibular gland along the right mylohyoid anteriorly and the other anterior to the hyoid bone extending anteriorly to the submental drain. These areas of fluid likely communicate. 2. Surrounding edema and fat stranding likely represents a combination of cellulitis and postsurgical change. Mild mass effect on the adjacent oropharyngeal airway. Electronically Signed   By: Feliberto Harts MD   On: 07/02/2020 13:20        Scheduled Meds: . aspirin EC  81 mg Oral Daily  . Chlorhexidine Gluconate Cloth  6 each Topical Once   And  . Chlorhexidine Gluconate Cloth  6 each Topical Once  . dexamethasone (DECADRON) injection  10 mg Intravenous Once  . enoxaparin (LOVENOX) injection  40 mg Subcutaneous Q24H  . gabapentin  300 mg Oral TID  . insulin aspart  0-9 Units Subcutaneous Q4H  . latanoprost  1 drop Both Eyes QHS  . lisinopril  2.5 mg Oral Daily  . pravastatin  20 mg Oral Daily  . senna-docusate  1 tablet Oral BID  . vitamin B-12  1,000 mcg Oral Daily   Continuous Infusions: . sodium chloride 75 mL/hr at 07/03/20 0652  . ampicillin-sulbactam (UNASYN) IV 3 g (07/03/20 0909)     LOS: 3 days    Time spent:25 mins. More than 50% of that time was spent in counseling and/or coordination of care.      Burnadette Pop, MD [facility] P2/11/2020, 1:32 PM

## 2020-07-03 NOTE — Progress Notes (Signed)
HPI: Regina Peterson is an 65 y.o. female with h/o HTN, DM2 developed facial swelling secondary to a dental abscess over a week ago. She was seen by Dr. Thea Gist in the office on 06/26/20 at which time she had her remaining mandibular teeth extracted and an incision & drainage of the submental space performed. She was discharged on Clindamycin and since the procedure she reports persistent swelling of the area and dysphagia that has not gotten significantly better or worse. She denies any dyspnea, trismus, or fever. She went to OR on 06/30/20 for I&D of submandibular, submental, and sublingual spaces and has returned to the floor and is on IV Unasyn q6hr.   Interval History 2/5 - Patient reports that she feels slight better than she did before surgery though feels like her tongue is fairly swollen and it is affecting her speech. Has been able to swallow with mild difficulty. No dyspnea.   2/6 - Patient reports she is about the same today as she was yesterday (no worse, no better). However she mentions that she feels like her tongue is more swollen and she does at times have a little bit more difficulty swallowing and occasionally a harder time breathing when supine. WBC down from 20 to 18. Clinically looks about the same, submental penrose still draining purulence.  2/7 - Patient reports continued improvement in last 24 hours. Tongue feels less swollen. Swallowing and breathing are improved. WBC down from 18 to 12. Purulence still coming from submental drain.  Lab Results Last 48 Hours        Results for orders placed or performed during the hospital encounter of 06/30/20 (from the past 48 hour(s))  Basic metabolic panel     Status: Abnormal   Collection Time: 06/30/20 12:25 PM  Result Value Ref Range   Sodium 139 135 - 145 mmol/L   Potassium 4.7 3.5 - 5.1 mmol/L    Comment: SLIGHT HEMOLYSIS   Chloride 105 98 - 111 mmol/L   CO2 21 (L) 22 - 32 mmol/L   Glucose, Bld 128 (H) 70 - 99 mg/dL     Comment: Glucose reference range applies only to samples taken after fasting for at least 8 hours.   BUN 19 8 - 23 mg/dL   Creatinine, Ser 9.56 0.44 - 1.00 mg/dL   Calcium 21.3 8.9 - 08.6 mg/dL   GFR, Estimated >57 >84 mL/min    Comment: (NOTE) Calculated using the CKD-EPI Creatinine Equation (2021)    Anion gap 13 5 - 15    Comment: Performed at [facility], [address] [place], [place] [address]  CBC with Differential     Status: Abnormal   Collection Time: 06/30/20 12:25 PM  Result Value Ref Range   WBC 15.1 (H) 4.0 - 10.5 K/uL   RBC 5.45 (H) 3.87 - 5.11 MIL/uL   Hemoglobin 12.8 12.0 - 15.0 g/dL   HCT 52.8 41.3 - 24.4 %   MCV 74.3 (L) 80.0 - 100.0 fL   MCH 23.5 (L) 26.0 - 34.0 pg   MCHC 31.6 30.0 - 36.0 g/dL   RDW 01.0 27.2 - 53.6 %   Platelets 329 150 - 400 K/uL   nRBC 0.0 0.0 - 0.2 %   Neutrophils Relative % 73 %   Neutro Abs 11.2 (H) 1.7 - 7.7 K/uL   Lymphocytes Relative 13 %   Lymphs Abs 2.0 0.7 - 4.0 K/uL   Monocytes Relative 12 %   Monocytes Absolute 1.8 (H) 0.1 -  1.0 K/uL   Eosinophils Relative 1 %   Eosinophils Absolute 0.1 0.0 - 0.5 K/uL   Basophils Relative 0 %   Basophils Absolute 0.1 0.0 - 0.1 K/uL   Immature Granulocytes 1 %   Abs Immature Granulocytes 0.07 0.00 - 0.07 K/uL    Comment: Performed at [facility], [address], [place], [place] [address]       Imaging Results (Last 48 hours)  CT Soft Tissue Neck W Contrast  Result Date: 06/30/2020 CLINICAL DATA:  Neck abscess, deep soft tissue swelling. Post dental. EXAM: CT NECK WITH CONTRAST TECHNIQUE: Multidetector CT imaging of the neck was performed using the standard protocol following the bolus administration of intravenous contrast. CONTRAST:  48mL OMNIPAQUE IOHEXOL 300 MG/ML  SOLN COMPARISON:  None. FINDINGS: There is a large multiloculated fluid collection involving the right eccentric floor of mouth with mild peripheral enhancement, which  may extend from the root of the recently extracted mandibular teeth anteriorly on the right. The abscess is complex, measuring up to 3.5 by 3.8 x 1.6 (AP by transverse by craniocaudal). Abscess extends inferiorly to abut the hyoid without bony destruction. Inflammatory changes track posteriorly to involve the base of tongue region and submandibular spaces with mild mass effect on the airway, which remains patent. Inflammatory changes also extend inferiorly into the subcutaneous fat, compatible cellulitis. There is also myositis of the mylohyoid. There is reactive lymphadenopathy. An airfilled tube in the subcutaneous tissues of the chin which extends outside the skin, presumably surgical. Multiple extracted teeth. Unremarkable parotid glands. Normal thyroid. Visualized upper chest is negative. Visualized intracranial brain is grossly unremarkable. Vasculature is grossly patent. Unremarkable visualized orbits. Mild paranasal sinus mucosal thickening without air-fluid levels. Extensive multilevel degenerative change with prior ACDF. IMPRESSION: 1. Multilocular complex floor mouth abscess, likely odontogenic in origin in this patient status post recent dental extractions. Extensive associated edema extends posteriorly with mild mass effect on the oropharynx. Associated myositis of the mylohyoid and surrounding cellulitis. 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 2. An airfilled tube in the subcutaneous tissues of the chin which extends outside the skin, presumably surgical. Recommend correlation with direct inspection. 3. Reactive lymphadenopathy. Findings discussed with Dr. Jodi Mourning at 2:44 p.m. via telephone. Electronically Signed   By: Feliberto Harts MD   On: 06/30/2020 14:45     Review of Systems  HENT: Positive for dental problem, facial swelling, sore throat and trouble swallowing.   Respiratory: Negative.   Psychiatric/Behavioral: Negative.    Blood  pressure (!) 178/89, pulse (!) 111, temperature 98.7 F (37.1 C), temperature source Oral, resp. rate 16, SpO2 99 %. Physical Exam HENT:     Head:     Jaw: Tenderness and swelling present.     Mouth/Throat: Oropharyx is clear    Dentition: Dental abscesses present. Edentulous maxilla and recently edentulated mandible with healing extraction sockets.    Palate: No mass.     Pharynx: Oropharynx is clear. Uvula midline.    Extraoral - appreciable facial swelling in the submental and bilateral submandibular regions (R>>L), inferior border of the mandible not palpable. Penrose drains in place with purulent drainage expressed from a submental drain, no drainage from other drains.. No trismus with MIO 40+ mm.   Intraoral - recent extraction sockets and incisions approximated/close intraorally, no vestibular swelling however FOM is erythematous/edematous bilaterally (R>L) and tongue is elevated; oropharynx is clear w/o palatal draping.   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 Her condition is stable and she is on IV Unasyn q6hr. WBC bumped to 20 2/2 surgery and steroids likely.    OMFS Recommendations -CT MAXILLOFACIAL W/ CONTRAST   -- CT reviewed - airway is patent and drains are in correct location;  -Decadron 10 mg x 2 (ordered) to help with edema -Submandibular drain removed x 1 today -Okay to advance to soft diiet -Peridex (chlorohexidine) mouthrnise QID -Change Kerlix dressing as needed -Continue Unasyn 3g q6hr -Follow cultures -Daily CBC w/ diff  Please call me with questions, updates or concerns @ [phone].  Enis Slipper, DDS 06/30/2020, 3:35 PM

## 2020-07-04 DIAGNOSIS — K122 Cellulitis and abscess of mouth: Secondary | ICD-10-CM | POA: Diagnosis not present

## 2020-07-04 LAB — CBC
HCT: 32.4 % — ABNORMAL LOW (ref 36.0–46.0)
Hemoglobin: 10.4 g/dL — ABNORMAL LOW (ref 12.0–15.0)
MCH: 23.2 pg — ABNORMAL LOW (ref 26.0–34.0)
MCHC: 32.1 g/dL (ref 30.0–36.0)
MCV: 72.3 fL — ABNORMAL LOW (ref 80.0–100.0)
Platelets: 338 10*3/uL (ref 150–400)
RBC: 4.48 MIL/uL (ref 3.87–5.11)
RDW: 13.2 % (ref 11.5–15.5)
WBC: 18 10*3/uL — ABNORMAL HIGH (ref 4.0–10.5)
nRBC: 0 % (ref 0.0–0.2)

## 2020-07-04 LAB — GLUCOSE, CAPILLARY
Glucose-Capillary: 160 mg/dL — ABNORMAL HIGH (ref 70–99)
Glucose-Capillary: 111 mg/dL — ABNORMAL HIGH (ref 70–99)
Glucose-Capillary: 117 mg/dL — ABNORMAL HIGH (ref 70–99)

## 2020-07-04 MED ORDER — LISINOPRIL 5 MG PO TABS: 5.0000 mg | ORAL_TABLET | Freq: Every day | ORAL | 1 refills | Status: AC

## 2020-07-04 MED ORDER — LISINOPRIL 5 MG PO TABS
5.0000 mg | ORAL_TABLET | Freq: Every day | ORAL | Status: DC
  Administered 2020-07-04: 5 mg via ORAL
  Filled 2020-07-04: qty 1

## 2020-07-04 MED ORDER — AMOXICILLIN-POT CLAVULANATE 875-125 MG PO TABS: 1.0000 | ORAL_TABLET | Freq: Two times a day (BID) | ORAL | 0 refills | Status: AC

## 2020-07-04 NOTE — Discharge Summary (Signed)
Physician Discharge Summary  Regina Peterson NID:[id number] DOB: 08-10-1955 DOA: 06/30/2020  PCP: Patient, No Pcp Per  Admit date: 06/30/2020 Discharge date: 07/04/2020  Admitted From: Home Disposition:  Home  Discharge Condition:Stable CODE STATUS:FULL Diet recommendation: Heart Healthy   Brief/Interim Summary:  Patient is a 65 year old female with history of non-insulin-dependent diabetes type 2, hypertension, hyperlipidemia who was sent by her oral surgeon for the further evaluation of facial/neck swelling.  CT soft tissue showed multilobular complex  abscesses.  She was started on broad-spectrum antibiotics. Underwent I&D of bilateral sublingual abscesses, submental abscess, bilateral submandibular abscess on 2/12.  Dental surgery was following.  Currently she is tolerating soft diet.  Edema has significantly improved as well as pain.  She has been cleared by dental surgery for discharge.  She is medically stable for discharge to home with oral antibiotics.  She will follow-up with dental surgery on 07/06/2020 for drain removal.  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  CT soft tissue showed multiple abscesses .Underwent I&D of bilateral sublingual abscesses, submental abscess, bilateral submandibular abscess on 2/12.  Also on steroids for edema.   Diet has been advanced to soft and she is tolerating.   She will be discharged on Augmentin  Leukocytosis:   Could also be associated with steroids. Check CBC in a week.  Continue antibiotics, blood cultures have not shown any growth.  Wound culture showed Streptococcus Constellatus.    Lactic acidosis: Resolved  Non-insulin-dependent diabetes type 2: Fairly controlled.  Hemoglobin A1c of 7.  On Metformin at home.   Hypertension: Currently blood pressure up.  Lisinopril dose doubled.  She is to follow-up with her primary care for close  monitoring of her blood pressure  Hyperlipidemia: Continue home medication  Hypokalemia/hypomagnesemia: Supplemented   Discharge Diagnoses:  Active Problems:   Angina, Ludwig    Discharge Instructions  Discharge Instructions    Diet general   Complete by: As directed    soft   Discharge instructions   Complete by: As directed    1)Please take prescribed medications as instructed 2)Follow up with dental surgery on 07/06/20 for the removal of the drains.  Appointment has been set up. 3)Follow up with your PCP in a week.  Do a CBC test during the follow-up   Increase activity slowly   Complete by: As directed    No wound care   Complete by: As directed      Allergies as of 07/04/2020   No Known Allergies     Medication List    STOP taking these medications   clindamycin 150 MG capsule Commonly known as: CLEOCIN     TAKE these medications   amoxicillin-clavulanate 875-125 MG tablet Commonly known as: Augmentin Take 1 tablet by mouth 2 (two) times daily for 7 days.   aspirin EC 81 MG tablet Take 81 mg by mouth daily. Swallow whole.   chlorhexidine 0.12 % solution Commonly known as: PERIDEX 15 mLs by Mouth Rinse route 2 (two) times daily. Swish and spit   gabapentin 300 MG capsule Commonly known as: NEURONTIN Take 300 mg by mouth 3 (three) times daily.   HYDROcodone-acetaminophen 5-325 MG tablet Commonly known as: NORCO/VICODIN Take 1 tablet by mouth every 4 (four) hours as needed (pain).   ibuprofen 200 MG tablet Commonly known as: ADVIL Take 600 mg by mouth every 4 (four) hours as needed (pain).   latanoprost 0.005 % ophthalmic solution  Commonly known as: XALATAN Place 1 drop into both eyes at bedtime.   lisinopril 5 MG tablet Commonly known as: ZESTRIL Take 1 tablet (5 mg total) by mouth daily. What changed:   medication strength  how much to take   metFORMIN 1000 MG tablet Commonly known as: GLUCOPHAGE Take 1,000 mg by mouth 2 (two) times  daily.   multivitamin with minerals Tabs tablet Take 1 tablet by mouth daily.   pravastatin 20 MG tablet Commonly known as: PRAVACHOL Take 20 mg by mouth daily.   vitamin B-12 1000 MCG tablet Commonly known as: CYANOCOBALAMIN Take 1,000 mcg by mouth daily.       No Known Allergies  Consultations:  Dental   Procedures/Studies: CT Soft Tissue Neck W Contrast  Result Date: 06/30/2020 CLINICAL DATA:  Neck abscess, deep soft tissue swelling. Post dental. EXAM: CT NECK WITH CONTRAST TECHNIQUE: Multidetector CT imaging of the neck was performed using the standard protocol following the bolus administration of intravenous contrast. CONTRAST:  75mL OMNIPAQUE IOHEXOL 300 MG/ML  SOLN COMPARISON:  None. FINDINGS: There is a large multiloculated fluid collection involving the right eccentric floor of mouth with mild peripheral enhancement, which may extend from the root of the recently extracted mandibular teeth anteriorly on the right. The abscess is complex, measuring up to 3.5 by 3.8 x 1.6 (AP by transverse by craniocaudal). Abscess extends inferiorly to abut the hyoid without bony destruction. Inflammatory changes track posteriorly to involve the base of tongue region and submandibular spaces with mild mass effect on the airway, which remains patent. Inflammatory changes also extend inferiorly into the subcutaneous fat, compatible cellulitis. There is also myositis of the mylohyoid. There is reactive lymphadenopathy. An airfilled tube in the subcutaneous tissues of the chin which extends outside the skin, presumably surgical. Multiple extracted teeth. Unremarkable parotid glands. Normal thyroid. Visualized upper chest is negative. Visualized intracranial brain is grossly unremarkable. Vasculature is grossly patent. Unremarkable visualized orbits. Mild paranasal sinus mucosal thickening without air-fluid levels. Extensive multilevel degenerative change with prior ACDF. IMPRESSION: 1. Multilocular  complex floor mouth abscess, likely odontogenic in origin in this patient status post recent dental extractions. Extensive associated edema extends posteriorly with mild mass effect on the oropharynx. Associated myositis of the mylohyoid and surrounding cellulitis. 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 2. An airfilled tube in the subcutaneous tissues of the chin which extends outside the skin, presumably surgical. Recommend correlation with direct inspection. 3. Reactive lymphadenopathy. Findings discussed with Dr. Jodi Mourning at 2:44 p.m. via telephone. Electronically Signed   By: Feliberto Harts MD   On: 06/30/2020 14:45   CT MAXILLOFACIAL W CONTRAST  Result Date: 07/02/2020 CLINICAL DATA:  Cellulitis of the face. EXAM: CT MAXILLOFACIAL WITH CONTRAST TECHNIQUE: Multidetector CT imaging of the maxillofacial structures was performed with intravenous contrast. Multiplanar CT image reconstructions were also generated. CONTRAST:  75mL OMNIPAQUE IOHEXOL 300 MG/ML  SOLN COMPARISON:  CT of the neck June 30, 2018 FINDINGS: There are right submental and right submandibular transcutaneous drains in place. Status post drain is of the previously seen floor mouth abscess. There is a small residual ill-defined fluid collection extending from the anterior right submandibular gland along right mylohyoid anteriorly, which measures up to approximately 8 x 33 x 14 mm (see series 3, image 18 and series 7, image 50). There is another small ill-defined fluid collection immediately anterior to the hyoid bone which measures approximately 1 cm (see series 3, image 10) and extends anteriorly to  the submental drain. There is surrounding soft tissue edema and subcutaneous stranding of the floor mouth, tongue and overlying subcutaneous soft tissues, compatible with cellulitis and or postoperative change. Mild mass effect on the adjacent oropharyngeal airway. Parotid glands are unremarkable.  Submandibular glands are likely mildly inflamed, but appear symmetric. Status post dental extractions. Mild scattered ethmoid air cell opacification. Mastoid air cells are clear. No overt intracranial abnormality in the visualized brain. Visualized vasculature is grossly patent. Partially imaged upper cervical degenerative change with ACDF spanning C3 through C5. Orbits are unremarkable. IMPRESSION: 1. Status post incision and drainage of the right floor of mouth abscess with placement of two drains. The previously described abscess is significantly improved with two residual ill-defined areas of fluid, one extending form the anterior right submandibular gland along the right mylohyoid anteriorly and the other anterior to the hyoid bone extending anteriorly to the submental drain. These areas of fluid likely communicate. 2. Surrounding edema and fat stranding likely represents a combination of cellulitis and postsurgical change. Mild mass effect on the adjacent oropharyngeal airway. Electronically Signed   By: Feliberto Harts MD   On: 07/02/2020 13:20       Subjective: Patient seen and examined at the bedside this morning.  Medically stable for discharge.  Discharge Exam: Vitals:   07/04/20 0343 07/04/20 0739  BP: (!) 157/67 (!) 170/83  Pulse: 90 77  Resp: 20 19  Temp: 97.7 F (36.5 C) 97.8 F (36.6 C)  SpO2: 97% 96%   Vitals:   07/03/20 2019 07/03/20 2344 07/04/20 0343 07/04/20 0739  BP: (!) 155/77 (!) 153/84 (!) 157/67 (!) 170/83  Pulse: 98 81 90 77  Resp: 20 19 20 19   Temp: 98.4 F (36.9 C) (!) 97.5 F (36.4 C) 97.7 F (36.5 C) 97.8 F (36.6 C)  TempSrc: Oral Oral Oral Oral  SpO2: 98% 99% 97% 96%  Weight:   67.9 kg   Height:        General: Pt is alert, awake, not in acute distress Cardiovascular: RRR, S1/S2 +, no rubs, no gallops Respiratory: CTA bilaterally, no wheezing, no rhonchi Abdominal: Soft, NT, ND, bowel sounds + Extremities: no edema, no cyanosis    The  results of significant diagnostics from this hospitalization (including imaging, microbiology, ancillary and laboratory) are listed below for reference.     Microbiology: Recent Results (from the past 240 hour(s))  SARS Coronavirus 2 by RT PCR (hospital order, performed in [facility] hospital lab) Nasopharyngeal Nasopharyngeal Swab     Status: None   Collection Time: 06/30/20  2:49 PM   Specimen: Nasopharyngeal Swab  Result Value Ref Range Status   SARS Coronavirus 2 NEGATIVE NEGATIVE Final    Comment: (NOTE) SARS-CoV-2 target nucleic acids are NOT DETECTED.  The SARS-CoV-2 RNA is generally detectable in upper and lower respiratory specimens during the acute phase of infection. The lowest concentration of SARS-CoV-2 viral copies this assay can detect is 250 copies / mL. A negative result does not preclude SARS-CoV-2 infection and should not be used as the sole basis for treatment or other patient management decisions.  A negative result may occur with improper specimen collection / handling, submission of specimen other than nasopharyngeal swab, presence of viral mutation(s) within the areas targeted by this assay, and inadequate number of viral copies (<250 copies / mL). A negative result must be combined with clinical observations, patient history, and epidemiological information.  Fact Sheet for Patients:   BoilerBrush.com.cy  Fact Sheet for Healthcare Providers: https://pope.com/  This test is not yet approved or  cleared by the [place] FDA and has been authorized for detection and/or diagnosis of SARS-CoV-2 by FDA under an Emergency Use Authorization (EUA).  This EUA will remain in effect (meaning this test can be used) for the duration of the COVID-19 declaration under Section 564(b)(1) of the Act, 21 U.S.C. section 360bbb-3(b)(1), unless the authorization is terminated or revoked sooner.  Performed at [facility], [address] [place], [place] [address]   Blood culture (routine x 2)     Status: None (Preliminary result)   Collection Time: 06/30/20  4:21 PM   Specimen: BLOOD RIGHT HAND  Result Value Ref Range Status   Specimen Description BLOOD RIGHT HAND  Final   Special Requests   Final    BOTTLES DRAWN AEROBIC AND ANAEROBIC Blood Culture adequate volume   Culture   Final    NO GROWTH 4 DAYS Performed at [facility], [address] [place], [place] [address]    Report Status PENDING  Incomplete  Blood culture (routine x 2)     Status: None (Preliminary result)   Collection Time: 06/30/20  4:25 PM   Specimen: BLOOD LEFT HAND  Result Value Ref Range Status   Specimen Description BLOOD LEFT HAND  Final   Special Requests   Final    BOTTLES DRAWN AEROBIC AND ANAEROBIC Blood Culture adequate volume   Culture   Final    NO GROWTH 4 DAYS Performed at [facility], [address] [place], [place] [address]    Report Status PENDING  Incomplete  Aerobic/Anaerobic Culture (surgical/deep wound)     Status: None (Preliminary result)   Collection Time: 06/30/20  7:14 PM   Specimen: Abscess  Result Value Ref Range Status   Specimen Description ABSCESS  Final   Special Requests MANDIBLE  Final   Gram Stain   Final    ABUNDANT WBC PRESENT, PREDOMINANTLY PMN ABUNDANT GRAM NEGATIVE RODS MODERATE GRAM POSITIVE COCCI MODERATE GRAM POSITIVE RODS    Culture   Final    RARE STREPTOCOCCUS CONSTELLATUS NO ANAEROBES ISOLATED; CULTURE IN PROGRESS FOR 5 DAYS    Report Status PENDING  Incomplete   Organism ID, Bacteria STREPTOCOCCUS CONSTELLATUS  Final      Susceptibility   Streptococcus constellatus - MIC*    PENICILLIN INTERMEDIATE Intermediate     CEFTRIAXONE 1 SENSITIVE Sensitive     ERYTHROMYCIN <=0.12 SENSITIVE Sensitive     LEVOFLOXACIN <=0.25 SENSITIVE Sensitive     VANCOMYCIN Value in next row Sensitive      0.5 SENSITIVEPerformed at [facility], [address] [place], [place] [address]    * RARE STREPTOCOCCUS CONSTELLATUS  MRSA PCR Screening     Status: None   Collection Time: 07/01/20  3:18 PM   Specimen: Nasal Mucosa; Nasopharyngeal  Result Value Ref Range Status   MRSA by PCR NEGATIVE NEGATIVE Final    Comment:        The GeneXpert MRSA Assay (FDA approved for NASAL specimens only), is one component of a comprehensive MRSA colonization surveillance program. It is not intended to diagnose MRSA infection nor to guide or monitor treatment for MRSA infections. Performed at [facility], [address] [place], [place] [address]      Labs: BNP (last 3 results) No results for input(s): BNP in the last 8760 hours. Basic Metabolic Panel: Recent Labs  Lab 06/30/20 1225 06/30/20 2141 07/01/20 0043  07/02/20 0227 07/03/20 0059  NA 139  --  140 142 139  K 4.7  --  3.3* 3.6 4.2  CL 105  --  109 110 105  CO2 21*  --  19* 23 26  GLUCOSE 128*  --  128* 132* 200*  BUN 19  --  16 16 8   CREATININE 0.84 0.90 0.84 0.63 0.73  CALCIUM 10.1  --  8.7* 8.8* 9.0  MG  --   --   --  1.7 2.1   Liver Function Tests: Recent Labs  Lab 07/01/20 0043  AST 24  ALT 15  ALKPHOS 94  BILITOT 0.8  PROT 6.2*  ALBUMIN 2.4*   No results for input(s): LIPASE, AMYLASE in the last 168 hours. No results for input(s): AMMONIA in the last 168 hours. CBC: Recent Labs  Lab 06/30/20 1225 06/30/20 2141 07/01/20 0043 07/02/20 0227 07/03/20 0059 07/04/20 0156  WBC 15.1* 8.3 20.7* 18.4* 12.2* 18.0*  NEUTROABS 11.2*  --   --  12.2*  --   --   HGB 12.8 12.3 10.8* 10.4* 11.5* 10.4*  HCT 40.5 38.6 31.8* 30.2* 34.1* 32.4*  MCV 74.3* 73.9* 72.4* 71.9* 71.5* 72.3*  PLT 329 270 260 286 338 338   Cardiac Enzymes: No results for input(s): CKTOTAL, CKMB, CKMBINDEX, TROPONINI in the last 168 hours. BNP: Invalid input(s): POCBNP CBG: Recent Labs  Lab 07/03/20 1626 07/03/20 2027 07/03/20 2343 07/04/20 0348 07/04/20 0732  GLUCAP 151* 174* 167* 160*  111*   D-Dimer No results for input(s): DDIMER in the last 72 hours. Hgb A1c No results for input(s): HGBA1C in the last 72 hours. Lipid Profile No results for input(s): CHOL, HDL, LDLCALC, TRIG, CHOLHDL, LDLDIRECT in the last 72 hours. Thyroid function studies No results for input(s): TSH, T4TOTAL, T3FREE, THYROIDAB in the last 72 hours.  Invalid input(s): FREET3 Anemia work up No results for input(s): VITAMINB12, FOLATE, FERRITIN, TIBC, IRON, RETICCTPCT in the last 72 hours. Urinalysis No results found for: COLORURINE, APPEARANCEUR, LABSPEC, PHURINE, GLUCOSEU, HGBUR, BILIRUBINUR, KETONESUR, PROTEINUR, UROBILINOGEN, NITRITE, LEUKOCYTESUR Sepsis Labs Invalid input(s): PROCALCITONIN,  WBC,  LACTICIDVEN Microbiology Recent Results (from the past 240 hour(s))  SARS Coronavirus 2 by RT PCR (hospital order, performed in [facility] hospital lab) Nasopharyngeal Nasopharyngeal Swab     Status: None   Collection Time: 06/30/20  2:49 PM   Specimen: Nasopharyngeal Swab  Result Value Ref Range Status   SARS Coronavirus 2 NEGATIVE NEGATIVE Final    Comment: (NOTE) SARS-CoV-2 target nucleic acids are NOT DETECTED.  The SARS-CoV-2 RNA is generally detectable in upper and lower respiratory specimens during the acute phase of infection. The lowest concentration of SARS-CoV-2 viral copies this assay can detect is 250 copies / mL. A negative result does not preclude SARS-CoV-2 infection and should not be used as the sole basis for treatment or other patient management decisions.  A negative result may occur with improper specimen collection / handling, submission of specimen other than nasopharyngeal swab, presence of viral mutation(s) within the areas targeted by this assay, and inadequate number of viral copies (<250 copies / mL). A negative result must be combined with clinical observations, patient history, and epidemiological information.  Fact Sheet for Patients:    08/28/20  Fact Sheet for Healthcare Providers: BoilerBrush.com.cy  This test is not yet approved or  cleared by the https://pope.com/ FDA and has been authorized for detection and/or diagnosis of SARS-CoV-2 by FDA under an Emergency Use Authorization (EUA).  This EUA will remain  in effect (meaning this test can be used) for the duration of the COVID-19 declaration under Section 564(b)(1) of the Act, 21 U.S.C. section 360bbb-3(b)(1), unless the authorization is terminated or revoked sooner.  Performed at [facility], [address] [place], [place] [address]   Blood culture (routine x 2)     Status: None (Preliminary result)   Collection Time: 06/30/20  4:21 PM   Specimen: BLOOD RIGHT HAND  Result Value Ref Range Status   Specimen Description BLOOD RIGHT HAND  Final   Special Requests   Final    BOTTLES DRAWN AEROBIC AND ANAEROBIC Blood Culture adequate volume   Culture   Final    NO GROWTH 4 DAYS Performed at [facility], [address], [place], [place] [address]    Report Status PENDING  Incomplete  Blood culture (routine x 2)     Status: None (Preliminary result)   Collection Time: 06/30/20  4:25 PM   Specimen: BLOOD LEFT HAND  Result Value Ref Range Status   Specimen Description BLOOD LEFT HAND  Final   Special Requests   Final    BOTTLES DRAWN AEROBIC AND ANAEROBIC Blood Culture adequate volume   Culture   Final    NO GROWTH 4 DAYS Performed at [facility], [address], [place], [place] [address]    Report Status PENDING  Incomplete  Aerobic/Anaerobic Culture (surgical/deep wound)     Status: None (Preliminary result)   Collection Time: 06/30/20  7:14 PM   Specimen: Abscess  Result Value Ref Range Status   Specimen Description ABSCESS  Final   Special Requests MANDIBLE  Final   Gram Stain   Final    ABUNDANT WBC PRESENT, PREDOMINANTLY PMN ABUNDANT GRAM NEGATIVE RODS MODERATE GRAM  POSITIVE COCCI MODERATE GRAM POSITIVE RODS    Culture   Final    RARE STREPTOCOCCUS CONSTELLATUS NO ANAEROBES ISOLATED; CULTURE IN PROGRESS FOR 5 DAYS    Report Status PENDING  Incomplete   Organism ID, Bacteria STREPTOCOCCUS CONSTELLATUS  Final      Susceptibility   Streptococcus constellatus - MIC*    PENICILLIN INTERMEDIATE Intermediate     CEFTRIAXONE 1 SENSITIVE Sensitive     ERYTHROMYCIN <=0.12 SENSITIVE Sensitive     LEVOFLOXACIN <=0.25 SENSITIVE Sensitive     VANCOMYCIN Value in next row Sensitive      0.5 SENSIT[address]Performed at [facility], [address] [place], [place] [address]    * RARE STREPTOCOCCUS CONSTELLATUS  MRSA PCR Screening     Status: None   Collection Time: 07/01/20  3:18 PM   Specimen: Nasal Mucosa; Nasopharyngeal  Result Value Ref Range Status   MRSA by PCR NEGATIVE NEGATIVE Final    Comment:        The GeneXpert MRSA Assay (FDA approved for NASAL specimens only), is one component of a comprehensive MRSA colonization surveillance program. It is not intended to diagnose MRSA infection nor to guide or monitor treatment for MRSA infections. Performed at [facility], [address] [place], [place] [address]     Please note: You were cared for by a hospitalist during your hospital stay. Once you are discharged, your primary care physician will handle any further medical issues. Please note that NO REFILLS for any discharge medications will be authorized once you are discharged, as it is imperative that you return to your primary care physician (or establish a relationship with a primary care physician if you do  not have one) for your post hospital discharge needs so that they can reassess your need for medications and monitor your lab values.    Time coordinating discharge: 40 minutes  SIGNED:   Burnadette Pop, MD  [facility] 07/04/2020, 11:00 AM Pager [phone]  If 7PM-7AM, please contact  night-coverage [email or web address] Password [email or web address]

## 2020-07-04 NOTE — Plan of Care (Signed)
Pt. Discharge to home with husband. She is going home with 2 drains and will follow up out patient with MD. VS stable. Discharge instructions reviewed with patient., She verbalized understanding.Brynda Rim, RN

## 2020-07-04 NOTE — Progress Notes (Signed)
HPI: Regina Peterson is an 65 y.o. female with h/o HTN, DM2 developed facial swelling secondary to a dental abscess over a week ago. She was seen by Dr. Thea Gist in the office on 06/26/20 at which time she had her remaining mandibular teeth extracted and an incision & drainage of the submental space performed. She was discharged on Clindamycin and since the procedure she reports persistent swelling of the area and dysphagia that has not gotten significantly better or worse. She denies any dyspnea, trismus, or fever. She went to OR on 06/30/20 for I&D of submandibular, submental, and sublingual spaces and has returned to the floor and is on IV Unasyn q6hr.   Interval History 2/5 - Patient reports that she feels slight better than she did before surgery though feels like her tongue is fairly swollen and it is affecting her speech. Has been able to swallow with mild difficulty. No dyspnea.   2/6 - Patient reports she is about the same today as she was yesterday (no worse, no better). However she mentions that she feels like her tongue is more swollen and she does at times have a little bit more difficulty swallowing and occasionally a harder time breathing when supine. WBC down from 20 to 18. Clinically looks about the same, submental penrose still draining purulence.  2/7 - Patient reports continued improvement in last 24 hours. Tongue feels less swollen. Swallowing and breathing are improved. WBC down from 18 to 12. Purulence still coming from submental drain.  2/8 - Patient is feeling much better today. Swelling and pain are improved as is swallowing. WBC bumped to 18 likely 2/2 steroids x 3.  Lab Results Last 48 Hours        Results for orders placed or performed during the hospital encounter of 06/30/20 (from the past 48 hour(s))  Basic metabolic panel     Status: Abnormal   Collection Time: 06/30/20 12:25 PM  Result Value Ref Range   Sodium 139 135 - 145 mmol/L   Potassium 4.7 3.5 - 5.1  mmol/L    Comment: SLIGHT HEMOLYSIS   Chloride 105 98 - 111 mmol/L   CO2 21 (L) 22 - 32 mmol/L   Glucose, Bld 128 (H) 70 - 99 mg/dL    Comment: Glucose reference range applies only to samples taken after fasting for at least 8 hours.   BUN 19 8 - 23 mg/dL   Creatinine, Ser 6.94 0.44 - 1.00 mg/dL   Calcium 85.4 8.9 - 62.7 mg/dL   GFR, Estimated >03 >50 mL/min    Comment: (NOTE) Calculated using the CKD-EPI Creatinine Equation (2021)    Anion gap 13 5 - 15    Comment: Performed at [facility], [address] [place], [place] [address]  CBC with Differential     Status: Abnormal   Collection Time: 06/30/20 12:25 PM  Result Value Ref Range   WBC 15.1 (H) 4.0 - 10.5 K/uL   RBC 5.45 (H) 3.87 - 5.11 MIL/uL   Hemoglobin 12.8 12.0 - 15.0 g/dL   HCT 82.9 93.7 - 16.9 %   MCV 74.3 (L) 80.0 - 100.0 fL   MCH 23.5 (L) 26.0 - 34.0 pg   MCHC 31.6 30.0 - 36.0 g/dL   RDW 67.8 93.8 - 10.1 %   Platelets 329 150 - 400 K/uL   nRBC 0.0 0.0 - 0.2 %   Neutrophils Relative % 73 %   Neutro Abs 11.2 (H) 1.7 - 7.7 K/uL   Lymphocytes  Relative 13 %   Lymphs Abs 2.0 0.7 - 4.0 K/uL   Monocytes Relative 12 %   Monocytes Absolute 1.8 (H) 0.1 - 1.0 K/uL   Eosinophils Relative 1 %   Eosinophils Absolute 0.1 0.0 - 0.5 K/uL   Basophils Relative 0 %   Basophils Absolute 0.1 0.0 - 0.1 K/uL   Immature Granulocytes 1 %   Abs Immature Granulocytes 0.07 0.00 - 0.07 K/uL    Comment: Performed at [facility], [address] [place], [place] [address]       Imaging Results (Last 48 hours)  CT Soft Tissue Neck W Contrast  Result Date: 06/30/2020 CLINICAL DATA:  Neck abscess, deep soft tissue swelling. Post dental. EXAM: CT NECK WITH CONTRAST TECHNIQUE: Multidetector CT imaging of the neck was performed using the standard protocol following the bolus administration of intravenous contrast. CONTRAST:  79mL OMNIPAQUE IOHEXOL 300 MG/ML  SOLN COMPARISON:  None.  FINDINGS: There is a large multiloculated fluid collection involving the right eccentric floor of mouth with mild peripheral enhancement, which may extend from the root of the recently extracted mandibular teeth anteriorly on the right. The abscess is complex, measuring up to 3.5 by 3.8 x 1.6 (AP by transverse by craniocaudal). Abscess extends inferiorly to abut the hyoid without bony destruction. Inflammatory changes track posteriorly to involve the base of tongue region and submandibular spaces with mild mass effect on the airway, which remains patent. Inflammatory changes also extend inferiorly into the subcutaneous fat, compatible cellulitis. There is also myositis of the mylohyoid. There is reactive lymphadenopathy. An airfilled tube in the subcutaneous tissues of the chin which extends outside the skin, presumably surgical. Multiple extracted teeth. Unremarkable parotid glands. Normal thyroid. Visualized upper chest is negative. Visualized intracranial brain is grossly unremarkable. Vasculature is grossly patent. Unremarkable visualized orbits. Mild paranasal sinus mucosal thickening without air-fluid levels. Extensive multilevel degenerative change with prior ACDF. IMPRESSION: 1. Multilocular complex floor mouth abscess, likely odontogenic in origin in this patient status post recent dental extractions. Extensive associated edema extends posteriorly with mild mass effect on the oropharynx. Associated myositis of the mylohyoid and surrounding cellulitis. 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 2. An airfilled tube in the subcutaneous tissues of the chin which extends outside the skin, presumably surgical. Recommend correlation with direct inspection. 3. Reactive lymphadenopathy. Findings discussed with Dr. Jodi Mourning at 2:44 p.m. via telephone. Electronically Signed   By: Feliberto Harts MD   On: 06/30/2020 14:45     Review of Systems  HENT: Positive for  dental problem, facial swelling, sore throat and trouble swallowing.   Respiratory: Negative.   Psychiatric/Behavioral: Negative.    Blood pressure (!) 178/89, pulse (!) 111, temperature 98.7 F (37.1 C), temperature source Oral, resp. rate 16, SpO2 99 %. Physical Exam HENT:     Head:     Jaw: Tenderness and swelling present.     Mouth/Throat: Oropharyx is clear    Dentition: Dental abscesses present. Edentulous maxilla and recently edentulated mandible with healing extraction sockets.    Palate: No mass.     Pharynx: Oropharynx is clear. Uvula midline.    Extraoral - improvement in facial swelling in the submental and bilateral submandibular regions (R>>L), inferior border of the mandible not palpable. Penrose drains in place with purulent drainage expressed from a submental drain, no drainage from other drains.. No trismus with MIO 40+ mm.   Intraoral - recent extraction sockets and incisions approximated/close intraorally, no vestibular  swelling however FOM is erythematous/edematous bilaterally (R>L) and tongue is elevated however this is improving on exam; oropharynx is clear w/o palatal draping.   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 Her condition is improving and she is on IV Unasyn q6hr.    OMFS Recommendations -Patient is stable for discharge from oral surgery perspective  --Recommend Augmentin BID x 7 days (from date of discharge)  --Recommend Peridex rinse BID x 7 days  -- Pain medication prn  -- Will plan to leave drains in place as they are still producing pus  --Plan to have patient follow up in clinic @ [facility] in [place], [place] on Thursday 07/06/20 for drain removal  --Continue soft diet   Please call me with questions, updates or concerns @ [phone].  Enis Slipper, DDS 06/30/2020, 3:35 PM

## 2020-07-05 LAB — CULTURE, BLOOD (ROUTINE X 2)
Culture: NO GROWTH
Culture: NO GROWTH
Special Requests: ADEQUATE
Special Requests: ADEQUATE

## 2020-07-06 LAB — AEROBIC/ANAEROBIC CULTURE W GRAM STAIN (SURGICAL/DEEP WOUND)

## 2021-08-15 IMAGING — CT CT MAXILLOFACIAL W/ CM
3 series · 14 of 47 positions shown, 16 images · IV contrast (APPLIED)
Comparison: CT of the neck June 30, 2018

CLINICAL DATA: Cellulitis of the face.

EXAM:
CT MAXILLOFACIAL WITH CONTRAST
TECHNIQUE: Multidetector CT imaging of the maxillofacial structures was
performed with intravenous contrast. Multiplanar CT image
reconstructions were also generated.
CONTRAST:  75mL OMNIPAQUE IOHEXOL 300 MG/ML  SOLN

[Series 3: facial/orbits w 2.0 st · axial · 0.37mm/px · z∈[-238,-72]mm · 8 of 97 slices shown, 10 images]
[im 7/97  brain]
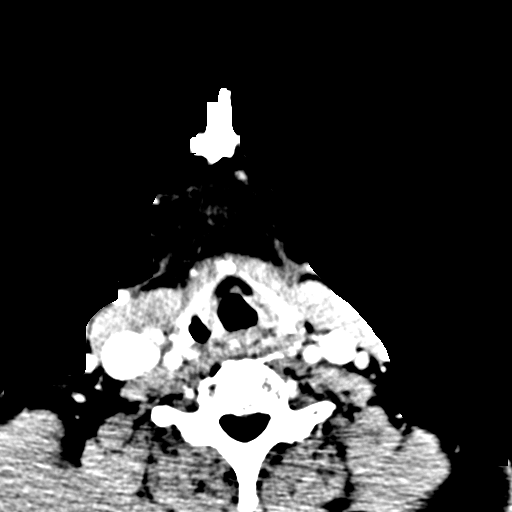
[im 7/97  bone]
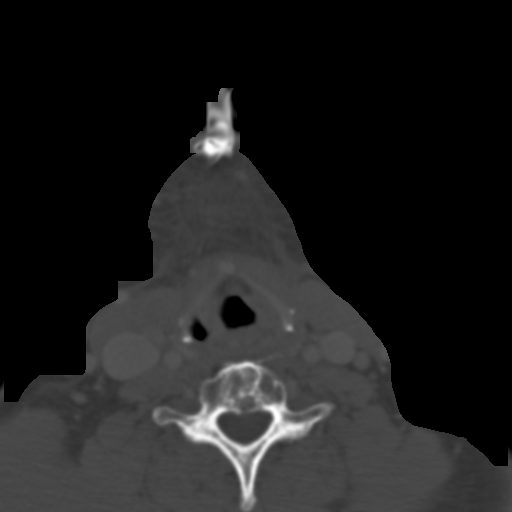
[im 20/97  bone]
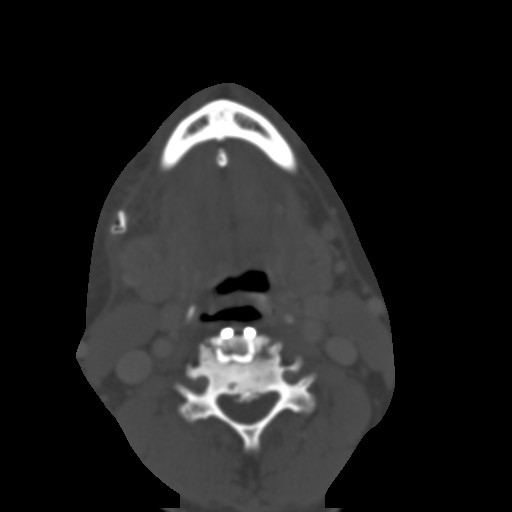
[im 30/97  bone]
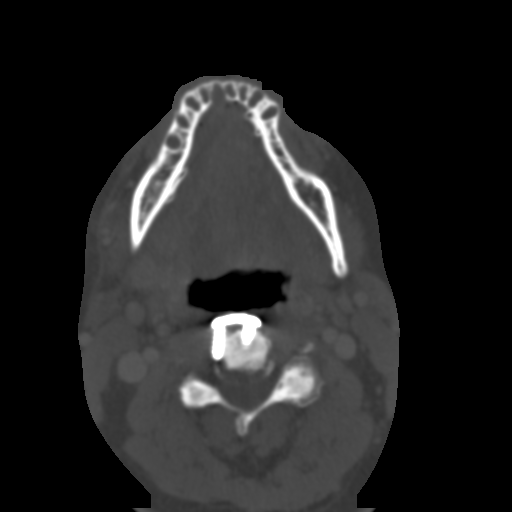
[im 44/97  bone]
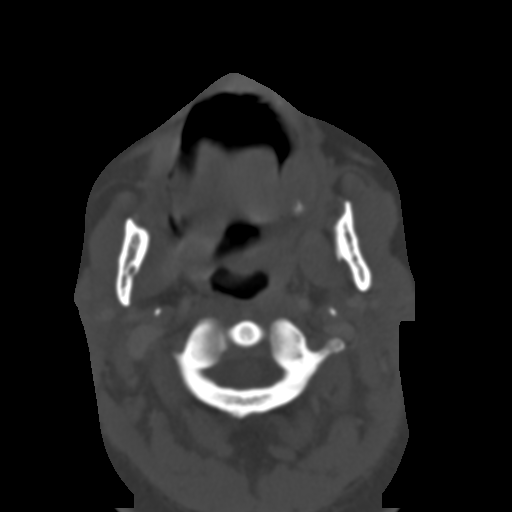
[im 53/97  brain]
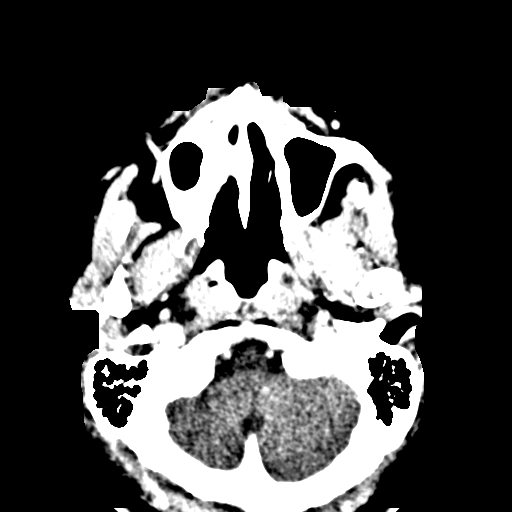
[im 53/97  bone]
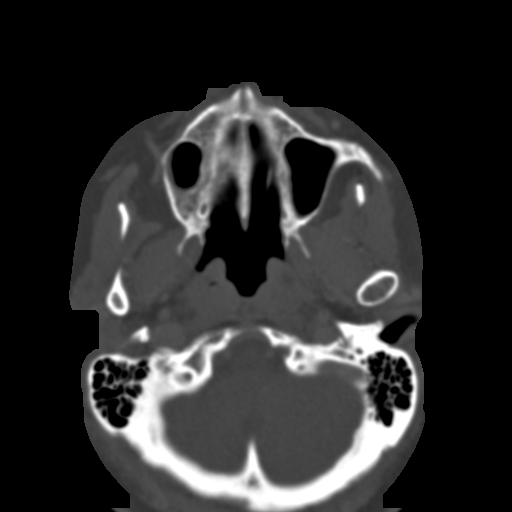
[im 67/97  bone]
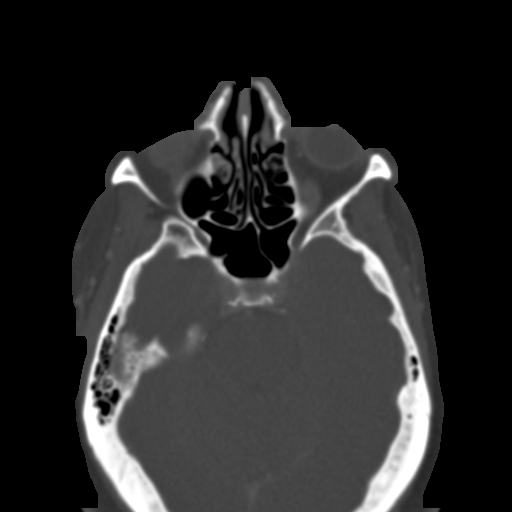
[im 77/97  bone]
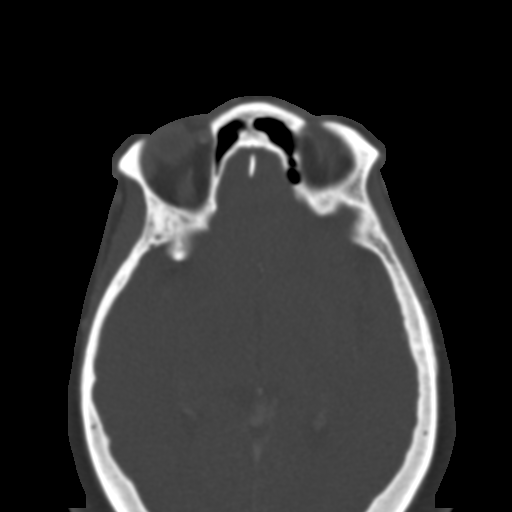
[im 90/97  bone]
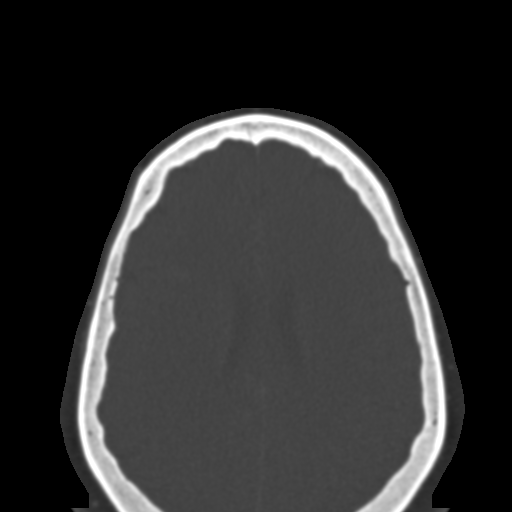

[Series 7: coronal soft tissue · coronal · 0.38mm/px · 3 of 97 slices shown]
[im 33/97  bone]
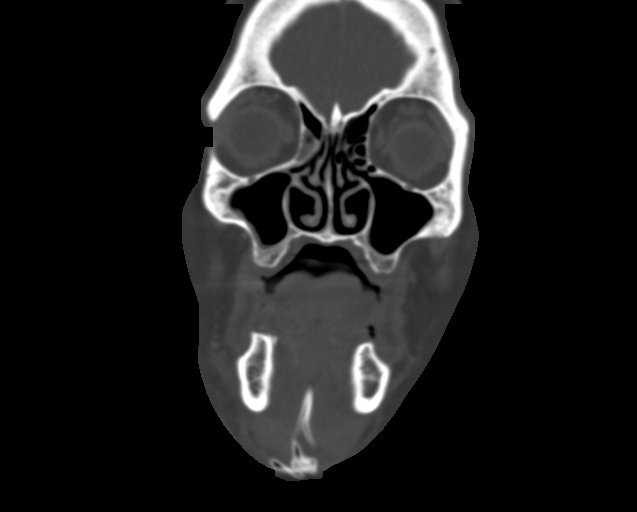
[im 43/97  bone]
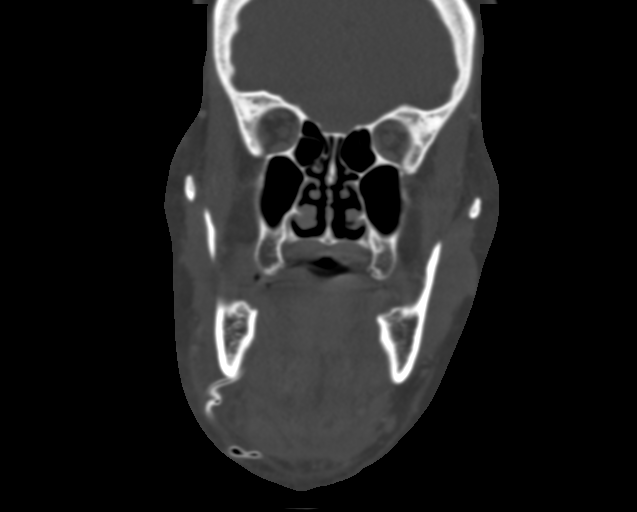
[im 54/97  bone]
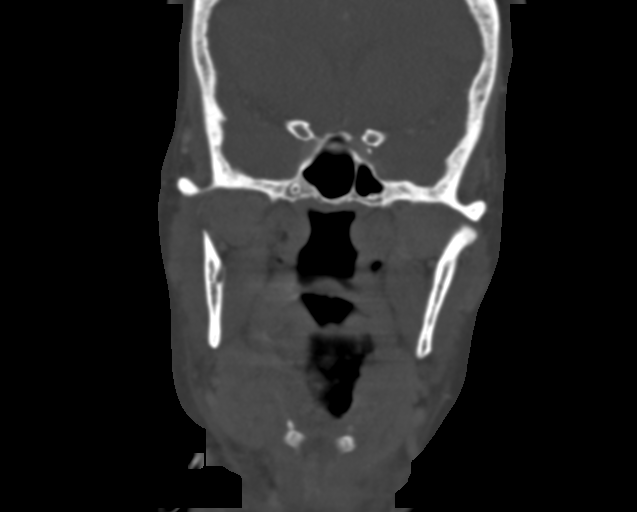

[Series 8: sagittal soft tissue · sagittal · 0.38mm/px · 3 of 109 slices shown]
[im 37/109  bone]
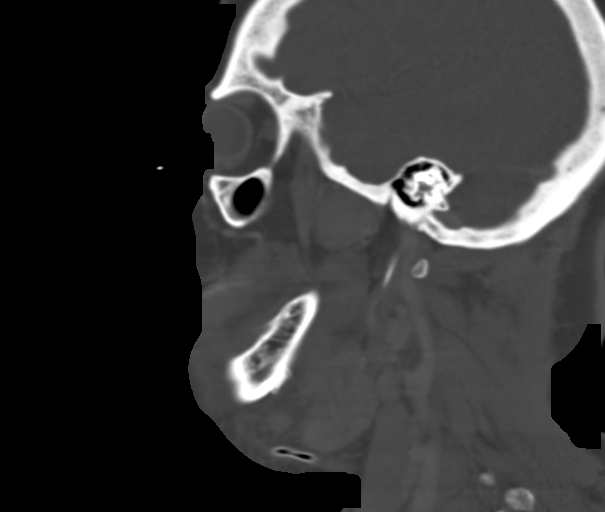
[im 55/109  bone]
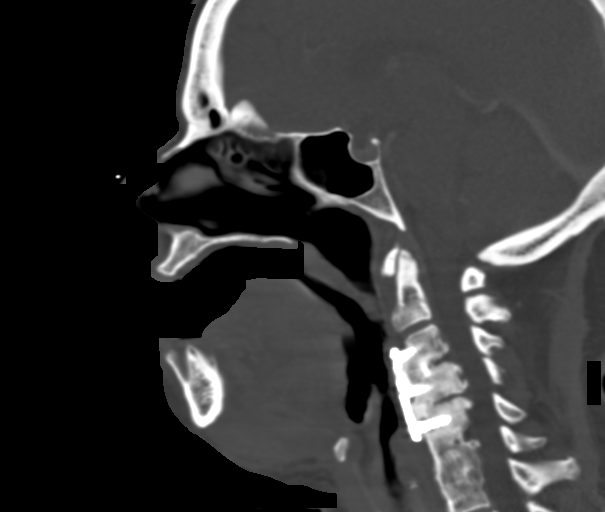
[im 73/109  bone]
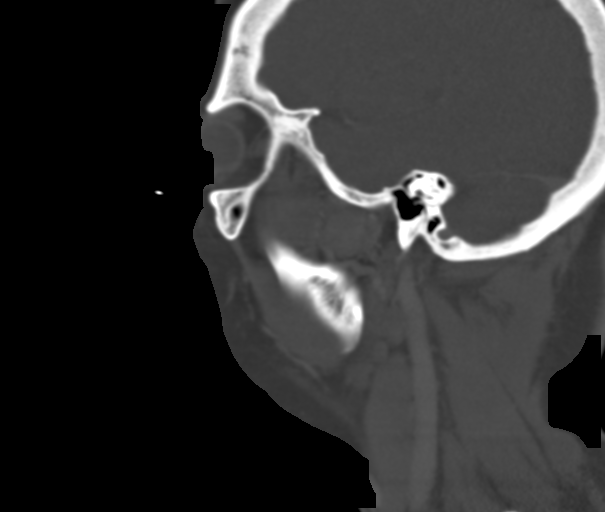

[14 of 47 positions shown; findings below may reference images not displayed]

FINDINGS: There are right submental and right submandibular transcutaneous
drains in place. Status post drain is of the previously seen floor
mouth abscess. There is a small residual ill-defined fluid
collection extending from the anterior right submandibular gland
along right mylohyoid anteriorly, which measures up to approximately
8 x 33 x 14 mm (see series 3, image 18 and series 7, image 50).
There is another small ill-defined fluid collection immediately
anterior to the hyoid bone which measures approximately 1 cm (see
series 3, image 10) and extends anteriorly to the submental drain.
There is surrounding soft tissue edema and subcutaneous stranding of
the floor mouth, tongue and overlying subcutaneous soft tissues,
compatible with cellulitis and or postoperative change. Mild mass
effect on the adjacent oropharyngeal airway.

Parotid glands are unremarkable. Submandibular glands are likely
mildly inflamed, but appear symmetric.

Status post dental extractions. Mild scattered ethmoid air cell
opacification. Mastoid air cells are clear. No overt intracranial
abnormality in the visualized brain. Visualized vasculature is
grossly patent. Partially imaged upper cervical degenerative change
with ACDF spanning C3 through C5. Orbits are unremarkable.
IMPRESSION: 1. Status post incision and drainage of the right floor of mouth
abscess with placement of two drains. The previously described
abscess is significantly improved with two residual ill-defined
areas of fluid, one extending form the anterior right submandibular
gland along the right mylohyoid anteriorly and the other anterior to
the hyoid bone extending anteriorly to the submental drain. These
areas of fluid likely communicate.
2. Surrounding edema and fat stranding likely represents a
combination of cellulitis and postsurgical change. Mild mass effect
on the adjacent oropharyngeal airway.
